# Patient Record
Sex: Male | Born: 1970 | Race: White | Hispanic: No | Marital: Married | Smoking: Never smoker
Health system: Southern US, Community
[De-identification: ages and names within clinical notes are randomized; demographics above are authoritative.]

## PROBLEM LIST (undated history)

## (undated) DIAGNOSIS — I10 Essential (primary) hypertension: Secondary | ICD-10-CM

## (undated) DIAGNOSIS — T7840XA Allergy, unspecified, initial encounter: Secondary | ICD-10-CM

## (undated) DIAGNOSIS — E785 Hyperlipidemia, unspecified: Secondary | ICD-10-CM

## (undated) DIAGNOSIS — G4730 Sleep apnea, unspecified: Secondary | ICD-10-CM

## (undated) DIAGNOSIS — J45909 Unspecified asthma, uncomplicated: Secondary | ICD-10-CM

## (undated) HISTORY — DX: Hyperlipidemia, unspecified: E78.5

## (undated) HISTORY — DX: Sleep apnea, unspecified: G47.30

## (undated) HISTORY — PX: APPENDECTOMY: SHX54

## (undated) HISTORY — DX: Allergy, unspecified, initial encounter: T78.40XA

---

## 2007-10-10 ENCOUNTER — Observation Stay (HOSPITAL_COMMUNITY): Admission: EM | Admit: 2007-10-10 | Discharge: 2007-10-11 | Payer: Self-pay | Admitting: Emergency Medicine

## 2007-10-10 ENCOUNTER — Encounter (INDEPENDENT_AMBULATORY_CARE_PROVIDER_SITE_OTHER): Payer: Self-pay | Admitting: General Surgery

## 2008-10-30 ENCOUNTER — Encounter: Admission: RE | Admit: 2008-10-30 | Discharge: 2008-10-30 | Payer: Self-pay | Admitting: Specialist

## 2009-10-26 ENCOUNTER — Encounter: Admission: RE | Admit: 2009-10-26 | Discharge: 2009-10-26 | Payer: Self-pay | Admitting: Specialist

## 2010-05-17 NOTE — Op Note (Signed)
NAMEINMER, NIX              ACCOUNT NO.:  [id number]   MEDICAL RECORD NO.:  [id number]          PATIENT TYPE:  OBV   LOCATION:  0098                         FACILITY:  [facility]   PHYSICIAN:  Anselm Pancoast. Weatherly, M.D.DATE OF BIRTH:  Mar 03, 1970   DATE OF PROCEDURE:  10/10/2007  DATE OF DISCHARGE:                               OPERATIVE REPORT   PREOPERATIVE DIAGNOSES:  Acute appendicitis, retrocecal, very high lying  cecum and exogenous obesity.   POSTOPERATIVE DIAGNOSIS:  Acute appendicitis, retrocecal, very high  lying cecum and exogenous obesity.   OPERATION:  Laparoscopic appendectomy.   ANESTHESIA:  General.   SURGEON:  Anselm Pancoast. Zachery Dakins, M.D.   ASSISTANT:  Nurse.   HISTORY:  Robert Kaufman is a 40 year old Caucasian male, 310 pounds,  who has had kind of an epigastric cramping sensation for about 2 days.  Today, he was seen by Dr. Raechel Ache, who found him tender, kind of  more in the upper right abdomen instead of the right lower quadrant and  referred him for a CT done at [facility].  There were no laboratory  studies performed.  CT was done without contrast or IV, and it is  consistent with an inflamed appendix and a lot inflammatory reaction,  kind of in the retrocecal area with a very high lying cecum.  The  patient was notified and they called and I suggested that he come to the  emergency room, where he was seen.  On review of the CT and also  examination, he is very tender, really at the right lateral rib cage  area.  He is not tender in the lower right abdomen.  On review of the  CT, this is where he has got a markedly inflamed appendix.  I started an  IV, got a CBC and CMET.  His white count was elevated and his CMET was  normal.  He was given 3 gm of Unasyn and approximately an hour later was  able to come over for laparoscopic appendectomy.  I discussed with him  that we would try to do it laparoscopically.  It is not truly a ruptured  appendix, and  hopefully we can remove it with the laparoscope.   DESCRIPTION OF PROCEDURE:  The patient was positioned on OR table with  PAS stockings, induction of general anesthesia endotracheal tube placed,  an oral tube into the stomach and then the abdomen was shaved around the  umbilicus, kind of left lower quadrant spot and right upper quadrant  spot.  I was not sure exactly where I was going to have to put the  trocars with his size.  A small incision was made below the umbilicus.  He has got about 4-5 inches of adipose tissue.  The fascia was opened.  A small incision picked up between two Kochers and then the underlying  preperitoneal fat and peritoneum was identified with kind of a little  opening made through the peritoneum.  Two traction sutures of 0 Vicryl  was placed and the Hassan cannula introduced.  On the bursal pass, I was  not really down into the  preperitoneal space when the peritoneal cavity  just in the preperitoneal space, but I positioned the Hassan cannula  again, and this time it was good and full of air into the peritoneum.  It was necessary to put the lock on the [facility] so it was as deep as  possible to go into the peritoneal cavity.  Next, __________ the small  bowel looks normal around the terminal ileum area, and I put a 5 mm  trocar, more like what you do with an appendectomy because that it where  the actual cecum and etc., is.  I then tried to figure out as far as  where would be the best for the lower trocar, placed it a lot more  medial than I normally do since we were trying to get to the right upper  quadrant.  Then with the camera sometimes at the umbilicus, other times  in the lower with the patient real rotated to the left and with the head  down kind of Trendelenburg position, I could then kind of open up the  peritoneum lateral.  I could identify a markedly inflamed appendix.  Fortunately, it was not ruptured, and its junction with the base of the  cecum I  could identify that fairly quickly.  I was able to get a  Kingsley Spittle or __________ grasper really right on the base of the appendix.  With this kind of elevated __________ dissection in the lower area, I  used the harmonic scalpel to kind of free up the little blood supply  going to the mesentery of the appendix.  The appendix itself was  markedly inflamed, nearly gangrenous, and I removed the fatty tissue  around it.  I then could get all except for the most distal portion of  the appendix with the exposure that I had.  At this point, I went ahead  and used the GI linear stapler to go across the base of the cecum of the  appendix, and then I could grasp the base of the appendix and elevate it  so it gave me a view in the distal portion of the appendix, which was  freed up completely.  Good hemostasis was obtained with the blunt  scalpel and then we put the inflamed appendix in an EndoCatch bag.  I  really looked carefully at the bed where we had removed everything.  I  think I good hemostasis.  Then when the camera was placed in the lower  pole, I withdrew the bag with the appendix and looked at it and then  completely removed the appendix.  Next, we reinspected the operative  field.  The closure of the cecum was good, no bleeding.  We irrigated,  aspirated it and aspirated the irrigation.  Next, the lower 10-11 in the  lower abdomen was withdrew.  There was no bleeding.  He has a very  __________ that was making it difficult for exposure.  Next the upper 5  mm port was withdrawn and then the carbon dioxide released.  I put two  additional figure-of-eight sutures of 0 Vicryl in the fascia below at  the umbilicus, tied all four and then closed the subcutaneous wound  after irrigation with 4-0 Monocryl.  Benzoin and Steri-Strips were  placed on the skin.  I am going to give him another 2-3 doses of  antibiotics intravenously.  Will check a white count in the morning.  If  his white count is  normal, we will let him go  home without antibiotics.  If he is having any elevated white count, he will probably go home on 2-  3 days of Augmentin.           ______________________________  Anselm Pancoast. Zachery Dakins, M.D.     WJW/MEDQ  D:  10/10/2007  T:  10/11/2007  Job:  [id number]

## 2010-05-17 NOTE — H&P (Signed)
NAMESAID, RUEB              ACCOUNT NO.:  [id number]   MEDICAL RECORD NO.:  [id number]          PATIENT TYPE:  OBV   LOCATION:  0098                         FACILITY:  [facility]   PHYSICIAN:  Anselm Pancoast. Weatherly, M.D.DATE OF BIRTH:  1970/08/06   DATE OF ADMISSION:  10/10/2007  DATE OF DISCHARGE:                              HISTORY & PHYSICAL   CHIEF COMPLAINT:  Abdominal pain.   HISTORY:  Robert Kaufman is a 40 year old male, who has had a couple of  days of pretty significant abdominal pain, kind of a cramping and  bloating all in the upper abdomen, and then yesterday evening his pain  sort of shifted to more in the right upper quadrant.  During the night,  he was kind of cramping and uncomfortable, not having any fever, and has  not actually vomited.  Then this morning, he was seen at the [facility], I think at [facility], and then sent over to  [facility] for a CAT scan.  The CAT scan showed an inflamed appendix  with a very high-lying cecum kind of in the subhepatic area, and then he  was referred to [place].  In the emergency room, I examined the patient and  he is definitely locally tender in the median subcostal area.   PAST MEDICAL HISTORY:  He is about 310 pounds, but he is on no  antihypertensive medications or other medical problems.   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   ALLERGIES:  None.   CHRONIC MEDICATIONS:  None.   PAST SURGICAL HISTORY:  I do not think he has actually had any surgery.  He has had no abdominal surgery at least.   PHYSICAL EXAMINATION:  VITAL SIGNS:  His temperature was 97.9, blood  pressure is 129/89, pulse 89, respirations 16.  The patient is about 6  feet 5 and weighs 310 pounds.  EYES, EARS, NOSE, AND THROAT:  Unremarkable.  LUNGS:  Clear.  CARDIAC:  Normal sinus rhythm.  ABDOMEN:  Definitely tender in the upper right abdomen with kind of  muscle guarding, but he is not generalized tender.  I did not  do a  rectal exam on him.  EXTREMITIES:  There is no pedal edema.  CNS:  Physiologic.   ADMISSION IMPRESSION:  1. Acute appendicitis, possibly kind of retrocecal and a very high-      lying cecum.  2. Exogenous obesity.   We will plan on proceeding with a laparoscopic appendectomy, and Unasyn  has been ordered preoperatively.  White count was about 11,600.  CMET  was unremarkable.           ______________________________  Anselm Pancoast. Zachery Dakins, M.D.     WJW/MEDQ  D:  10/10/2007  T:  10/10/2007  Job:  [id number]

## 2010-10-04 LAB — CBC
HCT: 43.1
HCT: 45.5
Hemoglobin: 15.5
MCHC: 33.3
MCV: 86.7
Platelets: 147 — ABNORMAL LOW
Platelets: 174
RBC: 5.27
RDW: 13.6
WBC: 11.6 — ABNORMAL HIGH

## 2010-10-04 LAB — COMPREHENSIVE METABOLIC PANEL
Albumin: 4
Alkaline Phosphatase: 70
BUN: 15
Chloride: 106
Potassium: 4
Total Bilirubin: 1.1

## 2010-10-04 LAB — DIFFERENTIAL
Basophils Absolute: 0
Basophils Relative: 0
Eosinophils Relative: 2
Monocytes Absolute: 0.6
Neutro Abs: 8.5 — ABNORMAL HIGH

## 2010-10-04 LAB — APTT: aPTT: 33

## 2012-02-20 ENCOUNTER — Other Ambulatory Visit: Payer: Self-pay | Admitting: Orthopedic Surgery

## 2012-02-21 ENCOUNTER — Encounter (HOSPITAL_COMMUNITY): Payer: Self-pay | Admitting: Pharmacy Technician

## 2012-02-22 ENCOUNTER — Encounter (HOSPITAL_COMMUNITY): Payer: Self-pay

## 2012-02-22 ENCOUNTER — Ambulatory Visit (HOSPITAL_COMMUNITY)
Admission: RE | Admit: 2012-02-22 | Discharge: 2012-02-22 | Disposition: A | Discharge status: Home / Self Care | Payer: 59 | Source: Ambulatory Visit | Attending: Orthopedic Surgery | Admitting: Orthopedic Surgery

## 2012-02-22 ENCOUNTER — Encounter (HOSPITAL_COMMUNITY)
Admission: RE | Admit: 2012-02-22 | Discharge: 2012-02-22 | Disposition: A | Discharge status: Home / Self Care | Payer: 59 | Source: Ambulatory Visit | Attending: Specialist | Admitting: Specialist

## 2012-02-22 DIAGNOSIS — M545 Low back pain, unspecified: Secondary | ICD-10-CM | POA: Insufficient documentation

## 2012-02-22 DIAGNOSIS — I10 Essential (primary) hypertension: Secondary | ICD-10-CM | POA: Insufficient documentation

## 2012-02-22 DIAGNOSIS — Z01818 Encounter for other preprocedural examination: Secondary | ICD-10-CM | POA: Insufficient documentation

## 2012-02-22 DIAGNOSIS — Z01812 Encounter for preprocedural laboratory examination: Secondary | ICD-10-CM | POA: Insufficient documentation

## 2012-02-22 HISTORY — DX: Unspecified asthma, uncomplicated: J45.909

## 2012-02-22 HISTORY — DX: Essential (primary) hypertension: I10

## 2012-02-22 LAB — SURGICAL PCR SCREEN: Staphylococcus aureus: POSITIVE — AB

## 2012-02-22 NOTE — Progress Notes (Signed)
PCR results faxed via EPIC to Dr Shelle Iron.

## 2012-02-22 NOTE — Progress Notes (Signed)
Cbc and BMP results placed on chart done 02/21/12 at [facility] PCP office.

## 2012-02-22 NOTE — Patient Instructions (Signed)
Robert Kaufman  02/22/2012   Your procedure is scheduled on:  02/23/12   Report to [facility] at   1030  AM.  Call this number if you have problems the morning of surgery: [phone]   Remember:   Do not eat food after midnite.  May have clear liquids until 0630am then npo.    Take these medicines the morning of surgery with A SIP OF WATER:    Do not wear jewelry,   Do not wear lotions, powders, or perfumes.    Men may shave face and neck.  Do not bring valuables to the hospital.  Contacts, dentures or bridgework may not be worn into surgery.  Leave suitcase in the car. After surgery it may be brought to your room.  For patients admitted to the hospital, checkout time is 11:00 AM the day of  discharge.   SEE CHG INSTRUCTION SHEET    Please read over the following fact sheets that you were given: MRSA Information, coughing and deep breathing exercises, leg exercises               Failure to comply with these instructions may result in cancellation of your surgery.                Patient Signature ____________________________              Nurse Signature _____________________________

## 2012-02-22 NOTE — Progress Notes (Signed)
Surgical clearance from 02/21/12 from Dr Beverley Fiedler on chart  EKG 06/01/11 on chart  Sleep Report 02/23/09 on chart  [facility] 10/18/11 note on chart

## 2012-02-23 ENCOUNTER — Encounter (HOSPITAL_COMMUNITY): Payer: Self-pay | Admitting: *Deleted

## 2012-02-23 ENCOUNTER — Ambulatory Visit (HOSPITAL_COMMUNITY): Payer: 59

## 2012-02-23 ENCOUNTER — Encounter (HOSPITAL_COMMUNITY)
Admission: RE | Disposition: A | Discharge status: Home / Self Care | Payer: Self-pay | Source: Ambulatory Visit | Attending: Specialist

## 2012-02-23 ENCOUNTER — Ambulatory Visit (HOSPITAL_COMMUNITY): Payer: 59 | Admitting: Registered Nurse

## 2012-02-23 ENCOUNTER — Ambulatory Visit (HOSPITAL_COMMUNITY)
Admission: RE | Admit: 2012-02-23 | Discharge: 2012-02-24 | Disposition: A | Discharge status: Home / Self Care | Payer: 59 | Source: Ambulatory Visit | Attending: Specialist | Admitting: Specialist

## 2012-02-23 ENCOUNTER — Encounter (HOSPITAL_COMMUNITY): Payer: Self-pay | Admitting: Registered Nurse

## 2012-02-23 DIAGNOSIS — Z6837 Body mass index (BMI) 37.0-37.9, adult: Secondary | ICD-10-CM | POA: Insufficient documentation

## 2012-02-23 DIAGNOSIS — J45909 Unspecified asthma, uncomplicated: Secondary | ICD-10-CM | POA: Insufficient documentation

## 2012-02-23 DIAGNOSIS — Z01812 Encounter for preprocedural laboratory examination: Secondary | ICD-10-CM | POA: Insufficient documentation

## 2012-02-23 DIAGNOSIS — M5126 Other intervertebral disc displacement, lumbar region: Secondary | ICD-10-CM

## 2012-02-23 DIAGNOSIS — I10 Essential (primary) hypertension: Secondary | ICD-10-CM | POA: Insufficient documentation

## 2012-02-23 DIAGNOSIS — Z79899 Other long term (current) drug therapy: Secondary | ICD-10-CM | POA: Insufficient documentation

## 2012-02-23 DIAGNOSIS — Z01818 Encounter for other preprocedural examination: Secondary | ICD-10-CM | POA: Insufficient documentation

## 2012-02-23 DIAGNOSIS — Z7982 Long term (current) use of aspirin: Secondary | ICD-10-CM | POA: Insufficient documentation

## 2012-02-23 HISTORY — PX: DECOMPRESSIVE LUMBAR LAMINECTOMY LEVEL 1: SHX5791

## 2012-02-23 SURGERY — DECOMPRESSIVE LUMBAR LAMINECTOMY LEVEL 1
Anesthesia: General | Site: Back | Laterality: Right | Wound class: Clean

## 2012-02-23 MED ORDER — ADULT MULTIVITAMIN W/MINERALS CH
1.0000 | ORAL_TABLET | Freq: Every day | ORAL | Status: DC
  Filled 2012-02-23 (×2): qty 1

## 2012-02-23 MED ORDER — METHOCARBAMOL 500 MG PO TABS
500.0000 mg | ORAL_TABLET | Freq: Four times a day (QID) | ORAL | Status: DC | PRN
  Administered 2012-02-23 – 2012-02-24 (×3): 500 mg via ORAL
  Filled 2012-02-23 (×3): qty 1

## 2012-02-23 MED ORDER — THROMBIN 5000 UNITS EX SOLR: CUTANEOUS | Status: DC | PRN

## 2012-02-23 MED ORDER — HYDROCHLOROTHIAZIDE 25 MG PO TABS
25.0000 mg | ORAL_TABLET | Freq: Every day | ORAL | Status: DC
  Filled 2012-02-23: qty 1

## 2012-02-23 MED ORDER — MIDAZOLAM HCL 5 MG/5ML IJ SOLN
INTRAMUSCULAR | Status: DC | PRN
  Administered 2012-02-23: 2 mg via INTRAVENOUS

## 2012-02-23 MED ORDER — HYDROMORPHONE HCL PF 1 MG/ML IJ SOLN
INTRAMUSCULAR | Status: AC
  Filled 2012-02-23: qty 1

## 2012-02-23 MED ORDER — LISINOPRIL 20 MG PO TABS
20.0000 mg | ORAL_TABLET | Freq: Every day | ORAL | Status: DC
  Filled 2012-02-23: qty 1

## 2012-02-23 MED ORDER — DOCUSATE SODIUM 100 MG PO CAPS
100.0000 mg | ORAL_CAPSULE | Freq: Two times a day (BID) | ORAL | Status: DC
  Administered 2012-02-23 – 2012-02-24 (×2): 100 mg via ORAL

## 2012-02-23 MED ORDER — ONDANSETRON HCL 4 MG/2ML IJ SOLN: 4.0000 mg | INTRAMUSCULAR | Status: DC | PRN

## 2012-02-23 MED ORDER — CHLORHEXIDINE GLUCONATE 4 % EX LIQD
60.0000 mL | Freq: Once | CUTANEOUS | Status: DC
  Filled 2012-02-23: qty 60

## 2012-02-23 MED ORDER — PROPOFOL 10 MG/ML IV BOLUS
INTRAVENOUS | Status: DC | PRN
  Administered 2012-02-23: 200 mg via INTRAVENOUS

## 2012-02-23 MED ORDER — PHENOL 1.4 % MT LIQD: 1.0000 | OROMUCOSAL | Status: DC | PRN

## 2012-02-23 MED ORDER — FLUTICASONE PROPIONATE 50 MCG/ACT NA SUSP
1.0000 | Freq: Every day | NASAL | Status: DC
  Filled 2012-02-23: qty 16

## 2012-02-23 MED ORDER — CEFAZOLIN SODIUM 1-5 GM-% IV SOLN
INTRAVENOUS | Status: AC
  Filled 2012-02-23: qty 50

## 2012-02-23 MED ORDER — HYDROMORPHONE HCL PF 1 MG/ML IJ SOLN
0.2500 mg | INTRAMUSCULAR | Status: DC | PRN
  Administered 2012-02-23 (×2): 0.5 mg via INTRAVENOUS

## 2012-02-23 MED ORDER — LIDOCAINE HCL (CARDIAC) 20 MG/ML IV SOLN
INTRAVENOUS | Status: DC | PRN
  Administered 2012-02-23: 100 mg via INTRAVENOUS

## 2012-02-23 MED ORDER — ONDANSETRON HCL 4 MG/2ML IJ SOLN
INTRAMUSCULAR | Status: DC | PRN
  Administered 2012-02-23: 4 mg via INTRAVENOUS

## 2012-02-23 MED ORDER — LACTATED RINGERS IV SOLN: INTRAVENOUS | Status: DC

## 2012-02-23 MED ORDER — SUCCINYLCHOLINE CHLORIDE 20 MG/ML IJ SOLN
INTRAMUSCULAR | Status: DC | PRN
  Administered 2012-02-23: 100 mg via INTRAVENOUS

## 2012-02-23 MED ORDER — LACTATED RINGERS IV SOLN
INTRAVENOUS | Status: DC
  Administered 2012-02-23 (×2): via INTRAVENOUS

## 2012-02-23 MED ORDER — ACETAMINOPHEN 650 MG RE SUPP: 650.0000 mg | RECTAL | Status: DC | PRN

## 2012-02-23 MED ORDER — ACETAMINOPHEN 325 MG PO TABS: 650.0000 mg | ORAL_TABLET | ORAL | Status: DC | PRN

## 2012-02-23 MED ORDER — METHOCARBAMOL 100 MG/ML IJ SOLN: 500.0000 mg | Freq: Four times a day (QID) | INTRAVENOUS | Status: DC | PRN

## 2012-02-23 MED ORDER — MOMETASONE FURO-FORMOTEROL FUM 100-5 MCG/ACT IN AERO
2.0000 | INHALATION_SPRAY | Freq: Two times a day (BID) | RESPIRATORY_TRACT | Status: DC
  Administered 2012-02-24: 2 via RESPIRATORY_TRACT
  Filled 2012-02-23: qty 8.8

## 2012-02-23 MED ORDER — SODIUM CHLORIDE 0.45 % IV SOLN
INTRAVENOUS | Status: DC
  Administered 2012-02-23: 18:00:00 via INTRAVENOUS

## 2012-02-23 MED ORDER — BUPIVACAINE-EPINEPHRINE 0.5% -1:200000 IJ SOLN
INTRAMUSCULAR | Status: DC | PRN
  Administered 2012-02-23: 18 mL

## 2012-02-23 MED ORDER — MONTELUKAST SODIUM 10 MG PO TABS
10.0000 mg | ORAL_TABLET | Freq: Every day | ORAL | Status: DC
  Filled 2012-02-23: qty 1

## 2012-02-23 MED ORDER — FENTANYL CITRATE 0.05 MG/ML IJ SOLN
INTRAMUSCULAR | Status: DC | PRN
  Administered 2012-02-23 (×4): 50 ug via INTRAVENOUS

## 2012-02-23 MED ORDER — HYDROCODONE-ACETAMINOPHEN 5-325 MG PO TABS
1.0000 | ORAL_TABLET | ORAL | Status: DC | PRN
  Administered 2012-02-24: 2 via ORAL
  Filled 2012-02-23: qty 2

## 2012-02-23 MED ORDER — PROMETHAZINE HCL 25 MG/ML IJ SOLN: 6.2500 mg | INTRAMUSCULAR | Status: DC | PRN

## 2012-02-23 MED ORDER — ACETAMINOPHEN 10 MG/ML IV SOLN
INTRAVENOUS | Status: AC
  Filled 2012-02-23: qty 100

## 2012-02-23 MED ORDER — SENNOSIDES-DOCUSATE SODIUM 8.6-50 MG PO TABS
1.0000 | ORAL_TABLET | Freq: Every evening | ORAL | Status: DC | PRN
  Filled 2012-02-23: qty 1

## 2012-02-23 MED ORDER — THROMBIN 5000 UNITS EX SOLR
CUTANEOUS | Status: AC
Start: 2012-02-23 — End: 2012-02-23
  Filled 2012-02-23: qty 10000

## 2012-02-23 MED ORDER — ROCURONIUM BROMIDE 100 MG/10ML IV SOLN
INTRAVENOUS | Status: DC | PRN
  Administered 2012-02-23: 50 mg via INTRAVENOUS
  Administered 2012-02-23: 10 mg via INTRAVENOUS

## 2012-02-23 MED ORDER — ACETAMINOPHEN 10 MG/ML IV SOLN
INTRAVENOUS | Status: DC | PRN
  Administered 2012-02-23: 1000 mg via INTRAVENOUS

## 2012-02-23 MED ORDER — CEFAZOLIN SODIUM-DEXTROSE 2-3 GM-% IV SOLR
INTRAVENOUS | Status: AC
  Filled 2012-02-23: qty 50

## 2012-02-23 MED ORDER — HYDROMORPHONE HCL PF 1 MG/ML IJ SOLN: 0.5000 mg | INTRAMUSCULAR | Status: DC | PRN

## 2012-02-23 MED ORDER — MENTHOL 3 MG MT LOZG: 1.0000 | LOZENGE | OROMUCOSAL | Status: DC | PRN

## 2012-02-23 MED ORDER — THROMBIN 5000 UNITS EX SOLR
OROMUCOSAL | Status: DC | PRN
  Administered 2012-02-23: 14:00:00 via TOPICAL

## 2012-02-23 MED ORDER — CEFAZOLIN SODIUM-DEXTROSE 2-3 GM-% IV SOLR
2.0000 g | Freq: Three times a day (TID) | INTRAVENOUS | Status: AC
  Administered 2012-02-23 – 2012-02-24 (×2): 2 g via INTRAVENOUS
  Filled 2012-02-23 (×2): qty 50

## 2012-02-23 MED ORDER — LISINOPRIL-HYDROCHLOROTHIAZIDE 20-25 MG PO TABS: 1.0000 | ORAL_TABLET | Freq: Every day | ORAL | Status: DC

## 2012-02-23 MED ORDER — OXYCODONE-ACETAMINOPHEN 7.5-325 MG PO TABS: 1.0000 | ORAL_TABLET | ORAL | Status: DC | PRN

## 2012-02-23 MED ORDER — SODIUM CHLORIDE 0.9 % IJ SOLN
3.0000 mL | Freq: Two times a day (BID) | INTRAMUSCULAR | Status: DC
  Administered 2012-02-24: 3 mL via INTRAVENOUS

## 2012-02-23 MED ORDER — BUPIVACAINE-EPINEPHRINE (PF) 0.5% -1:200000 IJ SOLN
INTRAMUSCULAR | Status: AC
  Filled 2012-02-23: qty 10

## 2012-02-23 MED ORDER — METHOCARBAMOL 500 MG PO TABS: 500.0000 mg | ORAL_TABLET | Freq: Three times a day (TID) | ORAL | Status: DC

## 2012-02-23 MED ORDER — SODIUM CHLORIDE 0.9 % IR SOLN
Status: DC | PRN
  Administered 2012-02-23: 14:00:00

## 2012-02-23 MED ORDER — BISACODYL 5 MG PO TBEC: 5.0000 mg | DELAYED_RELEASE_TABLET | Freq: Every day | ORAL | Status: DC | PRN

## 2012-02-23 MED ORDER — DEXTROSE 5 % IV SOLN: 3.0000 g | Freq: Three times a day (TID) | INTRAVENOUS | Status: DC

## 2012-02-23 MED ORDER — OXYCODONE-ACETAMINOPHEN 5-325 MG PO TABS
1.0000 | ORAL_TABLET | ORAL | Status: DC | PRN
  Administered 2012-02-23: 1 via ORAL
  Administered 2012-02-24: 2 via ORAL
  Filled 2012-02-23: qty 2
  Filled 2012-02-23: qty 1

## 2012-02-23 MED ORDER — SODIUM CHLORIDE 0.9 % IJ SOLN: 3.0000 mL | INTRAMUSCULAR | Status: DC | PRN

## 2012-02-23 MED ORDER — LORATADINE 10 MG PO TABS
10.0000 mg | ORAL_TABLET | Freq: Every day | ORAL | Status: DC
  Filled 2012-02-23: qty 1

## 2012-02-23 MED ORDER — SODIUM CHLORIDE 0.9 % IV SOLN: 250.0000 mL | INTRAVENOUS | Status: DC

## 2012-02-23 MED ORDER — DEXTROSE 5 % IV SOLN
3.0000 g | INTRAVENOUS | Status: AC
  Administered 2012-02-23: 3 g via INTRAVENOUS

## 2012-02-23 SURGICAL SUPPLY — 52 items
APL SKNCLS STERI-STRIP NONHPOA (GAUZE/BANDAGES/DRESSINGS)
BAG SPEC THK2 15X12 ZIP CLS (MISCELLANEOUS)
BAG ZIPLOCK 12X15 (MISCELLANEOUS) ×1 IMPLANT
BENZOIN TINCTURE PRP APPL 2/3 (GAUZE/BANDAGES/DRESSINGS) ×1 IMPLANT
CHLORAPREP W/TINT 26ML (MISCELLANEOUS) IMPLANT
CLEANER TIP ELECTROSURG 2X2 (MISCELLANEOUS) ×2 IMPLANT
CLOTH BEACON ORANGE TIMEOUT ST (SAFETY) ×2 IMPLANT
DECANTER SPIKE VIAL GLASS SM (MISCELLANEOUS) ×1 IMPLANT
DRAPE MICROSCOPE LEICA (MISCELLANEOUS) ×2 IMPLANT
DRAPE POUCH INSTRU U-SHP 10X18 (DRAPES) ×2 IMPLANT
DRAPE SURG 17X11 SM STRL (DRAPES) ×2 IMPLANT
DRSG AQUACEL AG ADV 3.5X 4 (GAUZE/BANDAGES/DRESSINGS) ×1 IMPLANT
DRSG EMULSION OIL 3X3 NADH (GAUZE/BANDAGES/DRESSINGS) IMPLANT
DRSG PAD ABDOMINAL 8X10 ST (GAUZE/BANDAGES/DRESSINGS) IMPLANT
DRSG TELFA 4X5 ISLAND ADH (GAUZE/BANDAGES/DRESSINGS) IMPLANT
DURAPREP 26ML APPLICATOR (WOUND CARE) ×2 IMPLANT
DURASEAL SPINE SEALANT 3ML (MISCELLANEOUS) IMPLANT
ELECT REM PT RETURN 9FT ADLT (ELECTROSURGICAL) ×2
ELECTRODE REM PT RTRN 9FT ADLT (ELECTROSURGICAL) ×1 IMPLANT
GLOVE BIOGEL PI IND STRL 7.5 (GLOVE) ×1 IMPLANT
GLOVE BIOGEL PI IND STRL 8 (GLOVE) ×1 IMPLANT
GLOVE BIOGEL PI INDICATOR 7.5 (GLOVE) ×1
GLOVE BIOGEL PI INDICATOR 8 (GLOVE) ×1
GLOVE SURG SS PI 7.5 STRL IVOR (GLOVE) ×2 IMPLANT
GLOVE SURG SS PI 8.0 STRL IVOR (GLOVE) ×4 IMPLANT
GOWN STRL REIN XL XLG (GOWN DISPOSABLE) ×4 IMPLANT
IV CATH 14GX2 1/4 (CATHETERS) ×2 IMPLANT
KIT BASIN OR (CUSTOM PROCEDURE TRAY) ×2 IMPLANT
KIT POSITIONING SURG ANDREWS (MISCELLANEOUS) ×2 IMPLANT
MANIFOLD NEPTUNE II (INSTRUMENTS) ×2 IMPLANT
NEEDLE SPNL 18GX3.5 QUINCKE PK (NEEDLE) ×4 IMPLANT
PATTIES SURGICAL .5 X.5 (GAUZE/BANDAGES/DRESSINGS) IMPLANT
PATTIES SURGICAL .75X.75 (GAUZE/BANDAGES/DRESSINGS) IMPLANT
PATTIES SURGICAL 1X1 (DISPOSABLE) IMPLANT
SPONGE LAP 18X18 X RAY DECT (DISPOSABLE) ×1 IMPLANT
SPONGE SURGIFOAM ABS GEL 100 (HEMOSTASIS) ×2 IMPLANT
STAPLER VISISTAT (STAPLE) IMPLANT
STRIP CLOSURE SKIN 1/2X4 (GAUZE/BANDAGES/DRESSINGS) ×1 IMPLANT
SUT PROLENE 3 0 PS 2 (SUTURE) ×1 IMPLANT
SUT VIC AB 0 CT1 27 (SUTURE)
SUT VIC AB 0 CT1 27XBRD ANTBC (SUTURE) IMPLANT
SUT VIC AB 1 CT1 27 (SUTURE)
SUT VIC AB 1 CT1 27XBRD ANTBC (SUTURE) ×1 IMPLANT
SUT VIC AB 1-0 CT2 27 (SUTURE) ×2 IMPLANT
SUT VIC AB 2-0 CT1 27 (SUTURE) ×2
SUT VIC AB 2-0 CT1 27XBRD (SUTURE) IMPLANT
SUT VIC AB 2-0 CT1 TAPERPNT 27 (SUTURE) ×1 IMPLANT
SUT VIC AB 2-0 CT2 27 (SUTURE) ×1 IMPLANT
SUT VICRYL 0 UR6 27IN ABS (SUTURE) IMPLANT
SYRINGE 10CC LL (SYRINGE) ×2 IMPLANT
TRAY LAMINECTOMY (CUSTOM PROCEDURE TRAY) ×2 IMPLANT
YANKAUER SUCT BULB TIP NO VENT (SUCTIONS) ×2 IMPLANT

## 2012-02-23 NOTE — Anesthesia Postprocedure Evaluation (Signed)
Anesthesia Post Note  Patient: Robert Kaufman  Procedure(s) Performed: Procedure(s) (LRB): MICRO-LUMBAR DECOMPRESSION  L4-L5 RIGHT     (Right)  Anesthesia type: General  Patient location: PACU  Post pain: Pain level controlled  Post assessment: Post-op Vital signs reviewed  Last Vitals:  Filed Vitals:   02/23/12 1630  BP: 110/64  Pulse: 76  Temp: 36.5 C  Resp: 12    Post vital signs: Reviewed  Level of consciousness: sedated  Complications: No apparent anesthesia complications

## 2012-02-23 NOTE — Transfer of Care (Signed)
Immediate Anesthesia Transfer of Care Note  Patient: Robert Kaufman  Procedure(s) Performed: Procedure(s) with comments: MICRO-LUMBAR DECOMPRESSION  L4-L5 RIGHT     (Right) - MICRO-LUMBAR DECOMPRESSION  L4-L5 RIGHT      Patient Location: PACU  Anesthesia Type:General  Level of Consciousness: awake, sedated and patient cooperative  Airway & Oxygen Therapy: Patient Spontanous Breathing and Patient connected to face mask oxygen  Post-op Assessment: Report given to PACU RN and Post -op Vital signs reviewed and stable  Post vital signs: Reviewed and stable  Complications: No apparent anesthesia complications

## 2012-02-23 NOTE — Preoperative (Signed)
Beta Blockers   Reason not to administer Beta Blockers:Not Applicable 

## 2012-02-23 NOTE — Anesthesia Preprocedure Evaluation (Addendum)
Anesthesia Evaluation  Patient identified by MRN, date of birth, ID band Patient awake    Reviewed: Allergy & Precautions, H&P , NPO status , Patient's Chart, lab work & pertinent test results  History of Anesthesia Complications (+) AWARENESS UNDER ANESTHESIA  Airway Mallampati: III TM Distance: >3 FB Neck ROM: Full    Dental  (+) Teeth Intact and Dental Advisory Given   Pulmonary neg pulmonary ROS, asthma ,  breath sounds clear to auscultation  Pulmonary exam normal       Cardiovascular hypertension, negative cardio ROS  Rhythm:Regular Rate:Normal     Neuro/Psych negative neurological ROS  negative psych ROS   GI/Hepatic negative GI ROS, Neg liver ROS,   Endo/Other  Morbid obesity  Renal/GU negative Renal ROS  negative genitourinary   Musculoskeletal negative musculoskeletal ROS (+)   Abdominal (+) + obese,   Peds  Hematology negative hematology ROS (+)   Anesthesia Other Findings   Reproductive/Obstetrics negative OB ROS                          Anesthesia Physical Anesthesia Plan  ASA: III  Anesthesia Plan: General   Post-op Pain Management:    Induction: Intravenous  Airway Management Planned: Oral ETT  Additional Equipment:   Intra-op Plan:   Post-operative Plan: Extubation in OR  Informed Consent: I have reviewed the patients History and Physical, chart, labs and discussed the procedure including the risks, benefits and alternatives for the proposed anesthesia with the patient or authorized representative who has indicated his/her understanding and acceptance.   Dental advisory given  Plan Discussed with: CRNA  Anesthesia Plan Comments:         Anesthesia Quick Evaluation

## 2012-02-23 NOTE — Brief Op Note (Signed)
02/23/2012  3:29 PM  PATIENT:  Savon Bordonaro  42 y.o. male  PRE-OPERATIVE DIAGNOSIS:  hnp and stenosis   POST-OPERATIVE DIAGNOSIS:  herniated nucleus pulposus  PROCEDURE:  Procedure(s) with comments: MICRO-LUMBAR DECOMPRESSION  L4-L5 RIGHT     (Right) - MICRO-LUMBAR DECOMPRESSION  L4-L5 RIGHT      SURGEON:  Surgeon(s) and Role:    * Javier Docker, MD - Primary  PHYSICIAN ASSISTANT:   ASSISTANTS: Dawayne Cirri ANESTHESIA:   general  EBL:  Total I/O In: 1000 [I.V.:1000] Out: -   BLOOD ADMINISTERED:none  DRAINS: none   LOCAL MEDICATIONS USED:  MARCAINE     SPECIMEN:  Source of Specimen:  L45  DISPOSITION OF SPECIMEN:  PATHOLOGY  COUNTS:  YES  TOURNIQUET:  * No tourniquets in log *  DICTATION: .Other Dictation: Dictation Number [id number]  PLAN OF CARE: Admit for overnight observation  PATIENT DISPOSITION:  PACU - hemodynamically stable.   Delay start of Pharmacological VTE agent (>24hrs) due to surgical blood loss or risk of bleeding: yes

## 2012-02-23 NOTE — H&P (Addendum)
Robert Kaufman is an 42 y.o. male.   Chief Complaint: right leg pain HPI: HNP stenosis L45 right refractory  Past Medical History  Diagnosis Date  . Hypertension   . Asthma     Past Surgical History  Procedure Laterality Date  . Appendectomy      History reviewed. No pertinent family history. Social History:  reports that he has never smoked. He has never used smokeless tobacco. He reports that he drinks about 0.5 ounces of alcohol per week. He reports that he does not use illicit drugs.  Allergies: No Known Allergies  Medications Prior to Admission  Medication Sig Dispense Refill  . cetirizine (ZYRTEC ALLERGY) 10 MG tablet Take 10 mg by mouth daily.      . fluticasone (FLONASE) 50 MCG/ACT nasal spray Place 1 spray into the nose daily.      . Fluticasone-Salmeterol (ADVAIR) 100-50 MCG/DOSE AEPB Inhale 1 puff into the lungs every 12 (twelve) hours.      Marland Kitchen HYDROcodone-acetaminophen (NORCO/VICODIN) 5-325 MG per tablet Take 1 tablet by mouth every 6 (six) hours as needed for pain.      Marland Kitchen lisinopril-hydrochlorothiazide (PRINZIDE,ZESTORETIC) 20-25 MG per tablet Take 1 tablet by mouth daily before breakfast.      . montelukast (SINGULAIR) 10 MG tablet Take 10 mg by mouth daily before breakfast.      . rosuvastatin (CRESTOR) 10 MG tablet Take 10 mg by mouth daily before breakfast.      . aspirin EC 81 MG tablet Take 81 mg by mouth daily.      . meloxicam (MOBIC) 7.5 MG tablet Take 7.5 mg by mouth daily.      . Multiple Vitamin (MULTIVITAMIN WITH MINERALS) TABS Take 1 tablet by mouth daily.        Results for orders placed during the hospital encounter of 02/22/12 (from the past 48 hour(s))  SURGICAL PCR SCREEN     Status: Abnormal   Collection Time    02/22/12 12:00 PM      Result Value Range   MRSA, PCR NEGATIVE  NEGATIVE   Staphylococcus aureus POSITIVE (*) NEGATIVE   Comment:            The Xpert SA Assay (FDA     approved for NASAL specimens     in patients over 21 years of  age),     is one component of     a comprehensive surveillance     program.  Test performance has     been validated by [employer] for patients greater     than or equal to 23 year old.     It is not intended     to diagnose infection nor to     guide or monitor treatment.   Dg Chest 2 View  02/22/2012  *RADIOLOGY REPORT*  Clinical Data: Preoperative evaluation for back surgery.  Right lower back pain.  History of hypertension  CHEST - 2 VIEW  Comparison: None.  Findings: Heart and mediastinal contours are within normal limits. Lung fields are clear with no signs of focal infiltrate or congestive failure.  No pleural fluid or significant peribronchial cuffing is seen.  Bony structures demonstrate mild degenerative change of the mid and lower thoracic spine and are otherwise intact.  IMPRESSION: No worrisome focal or acute cardiopulmonary abnormality noted   Original Report Authenticated By: Rhodia Albright, M.D.    Dg Lumbar Spine 2-3 Views  02/22/2012  *RADIOLOGY REPORT*  Clinical Data:  Right-sided low back pain.  Preoperative evaluation.  LUMBAR SPINE - 2-3 VIEW  Comparison: Lumbar MRI 10/30/2008.  Findings: There are five non-rib bearing lumbar-type vertebral bodies.  There is slight narrowing of the intervertebral disc space at the level of L4-L5.  Moderate multilevel osteophyte formation is seen representing degenerative spondylosis.  No fracture, dislocation, or bony destruction is evident.  IMPRESSION: Changes of degenerative disc disease and degenerative spondylosis.   Original Report Authenticated By: Onalee Hua Call     Review of Systems  Neurological: Positive for sensory change and focal weakness.  All other systems reviewed and are negative.    Blood pressure 108/66, pulse 76, temperature 98.3 F (36.8 Kaufman), resp. rate 20, SpO2 100.00%. Physical Exam  Vitals reviewed. Constitutional: He appears well-developed.  HENT:  Head: Normocephalic.  Eyes: Pupils are equal, round, and  reactive to light.  Neck: Normal range of motion.  Cardiovascular: Normal rate.   Respiratory: Effort normal.  GI: Soft.  Neurological: He is alert.  Skin: Skin is warm and dry.  Psychiatric: He has a normal mood and affect.   MRI HNP stenosis L45 right EHL 5-/5 right. SLR + No DVT. 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 Plan decompression. Risks discussed.  Robert Kaufman,Robert Kaufman 02/23/2012, 12:44 PM

## 2012-02-24 DIAGNOSIS — Z09 Encounter for follow-up examination after completed treatment for conditions other than malignant neoplasm: Secondary | ICD-10-CM

## 2012-02-24 NOTE — Care Management (Signed)
Cm spoke with patient concerning discharge planning. Pt ambulatory in hallway independently. No needs or HH services stated.   Roxy Manns Jaonna Word,RN,BSN [phone]

## 2012-02-24 NOTE — Evaluation (Signed)
Physical Therapy Evaluation Patient Details Name: Robert Kaufman MRN: [id number] DOB: 1970/08/01 Today's Date: 02/24/2012 Time: 4098-1191 PT Time Calculation (min): 33 min  PT Assessment / Plan / Recommendation Clinical Impression  Pt s/p microlumbar decompress presents with functional mobility limited by post op pain and back precautions.    PT Assessment  Patent does not need any further PT services    Follow Up Recommendations  No PT follow up    Does the patient have the potential to tolerate intense rehabilitation      Barriers to Discharge        Equipment Recommendations  None recommended by PT    Recommendations for Other Services OT consult   Frequency      Precautions / Restrictions Precautions Precautions: Back Restrictions Weight Bearing Restrictions: No   Pertinent Vitals/Pain 3/10 with amb      Mobility  Bed Mobility Bed Mobility: Supine to Sit;Sit to Supine;Rolling Right;Rolling Left;Left Sidelying to Sit;Sit to Sidelying Left Rolling Right: 5: Supervision Rolling Left: 5: Supervision Left Sidelying to Sit: 4: Min guard Supine to Sit: 4: Min guard Sit to Supine: 4: Min guard;5: Supervision Sit to Sidelying Left: 4: Min guard;5: Supervision Details for Bed Mobility Assistance: min cues for technique and to complete roll to side Transfers Transfers: Sit to Stand;Stand to Sit Sit to Stand: 5: Supervision Stand to Sit: 5: Supervision Details for Transfer Assistance: cues for use of UEs and avoidance of excess fwd flex Ambulation/Gait Ambulation/Gait Assistance: 4: Min guard;5: Supervision Ambulation Distance (Feet): 500 Feet Ambulation/Gait Assistance Details: min cues for pacing Gait Pattern: Step-through pattern Stairs: Yes Stairs Assistance: 4: Min guard Stair Management Technique: One rail Right;Step to pattern;Forwards Number of Stairs: 4    Exercises     PT Diagnosis:    PT Problem List:   PT Treatment Interventions:     PT  Goals Acute Rehab PT Goals PT Goal Formulation: With patient  Visit Information  Last PT Received On: 02/24/12 Assistance Needed: +1    Subjective Data  Subjective: Before surgery, I could only be up on my feet for 5 minutes before I had to lay back down Patient Stated Goal: Resume previous lifestyle with decreased pain   Prior Functioning  Home Living Lives With: Spouse Available Help at Discharge: Family Type of Home: House Home Access: Stairs to enter Secretary/administrator of Steps: 2 Entrance Stairs-Rails: None Home Layout: Two level Alternate Level Stairs-Number of Steps: 14 Alternate Level Stairs-Rails: Right Home Adaptive Equipment: None Prior Function Level of Independence: Independent Able to Take Stairs?: Yes Driving: Yes Vocation: Full time employment Communication Communication: No difficulties Dominant Hand: Right    Cognition  Cognition Overall Cognitive Status: Appears within functional limits for tasks assessed/performed Arousal/Alertness: Awake/alert Orientation Level: Appears intact for tasks assessed Behavior During Session: [facility] for tasks performed    Extremity/Trunk Assessment Right Upper Extremity Assessment RUE ROM/Strength/Tone: [facility] for tasks assessed Left Upper Extremity Assessment LUE ROM/Strength/Tone: WFL for tasks assessed Right Lower Extremity Assessment RLE ROM/Strength/Tone: [facility] for tasks assessed Left Lower Extremity Assessment LLE ROM/Strength/Tone: WFL for tasks assessed   Balance    End of Session PT - End of Session Activity Tolerance: Patient tolerated treatment well Patient left: in bed;with call bell/phone within reach (pt sitting in chair but unable to tolerate beyond 5 min 2* p) Nurse Communication: Mobility status  GP Functional Assessment Tool Used: clinical judgement Functional Limitation: Mobility: Walking and moving around Mobility: Walking and Moving Around Current Status ([id number]): 0 percent impaired,  limited or  restricted Mobility: Walking and Moving Around Goal Status [phone]): 0 percent impaired, limited or restricted Mobility: Walking and Moving Around Discharge Status [phone]): 0 percent impaired, limited or restricted   Jashiya Bassett 02/24/2012, 9:19 AM

## 2012-02-24 NOTE — Op Note (Signed)
NAMEOLUWAFERANMI, WAIN              ACCOUNT NO.:  [id number]  MEDICAL RECORD NO.:  [id number]  LOCATION:  1602                         FACILITY:  [facility]  PHYSICIAN:  Jene Every, M.D.    DATE OF BIRTH:  04-07-1970  DATE OF PROCEDURE:  02/23/2012 DATE OF DISCHARGE:                              OPERATIVE REPORT   PREOPERATIVE DIAGNOSES:  Spinal stenosis; herniated nucleus pulposus, L4- 5, right; obesity.  POSTOPERATIVE DIAGNOSES:  Spinal stenosis; herniated nucleus pulposus, L4-5, right; obesity.  PROCEDURES PERFORMED: 1. Microlumbar decompression L4-5. 2. Foraminotomies L4 and L5. 3. Microdiskectomy L4-5.  ANESTHESIA:  General.  ASSISTANT:  Lanna Poche.  __________ the patient's elevated BMI of 37.9.  HISTORY:  This is a 42 year old with chronic refractory lower extremity radicular pain, recurrent disk herniation, L5 nerve root distribution, had exacerbation without abatement.  MRI indicated larger disk herniation displacing the 5 root __________ EHL weakness.  The patient had 3 weeks of being unable to rise in the supine position for longer than 2-3 minutes at a time without severe radicular pain.  Failing conservative treatment is indicated for decompression.  Risks and benefits discussed including bleeding, infection, damage to neurovascular structure, DVT, PE, anesthetic complication, residual nerve pain, re-enervation neuritis, need for fusion in the future, etc.  TECHNIQUE:  With the patient in supine position, after induction of adequate general anesthesia, 3 g Kefzol and 600 clindamycin, he was placed prone on the Miller frame.  All bony prominences were well padded.  Lumbar region was prepped and draped in usual sterile fashion. Two 18-gauge spinal needles were utilized to localize L4-5 interspace, confirmed with x-ray.  Incision was made from spinous process 4-5. Subcutaneous tissue was dissected.  Electrocautery was utilized to achieve hemostasis.   Fascia lata identified and divided by line of skin incision.  Paraspinous muscle elevated.  McCullough retractor was placed.  Operating microscope was draped, brought into the surgical field.  Confirmatory radiograph obtained.  Hemilaminotomy in the caudad edge of 4 was performed with 3-mm Kerrison and a micro osteotome preserving the pars __________ hypertrophic facet, noted very small interlaminar window.  Microcurette and straight curette was utilized to detach ligamentum flavum from the cephalad edge of L5.  A Woodson retractor was utilized to free adhesions of the neural elements from the hypertrophic facet.  Then, performed a foraminotomy with a 2-mm and a 3- mm Kerrison for the 5 root with severe compression of the 5 root in the lateral recess with compression by the facet hypertrophy.  We decompressed lateral recess to the medial border pedicle with a 2-mm Kerrison.  I performed foraminotomy of L4 as this was stenotic as well. The significant tethering of the 5 root due to the disk herniation as well.  With minimal mobilization of the nerve root though with nerve root protected, we found a disk herniation and made a small annulotomy and removed extruded fragment in the inferior aspect of the disk space with a micro nerve hook and a micropituitary.  There, however, following this, was still __________ of the nerve root.  By x-ray, the patient had some calcification of the disk and did have a portion of disk herniation that was calcified consistent with  chronicity of his previous disk herniation.  With neuroprotection, __________ to mobilize the hard disk into the disk space which was then retrieved with a pituitary and also in the subannular space.  This space was entered, and multiple small fragments were removed.  __________ meticulous diskectomy, there was no further tension or compression of the root noted.  We had 1 cm of excursion of the 5 root near the pedicle without  tension.  A Woodson retractor probe was then placed freely up the foramen of 4 and 5 beneath the root.  We checked beneath the root, the axilla of the root, and also the shoulder of the root without residual disk herniation noted. Copiously irrigated disk space with antibiotic irrigation and obtained confirmatory radiograph with the instrument above and below the disk space.  Inspection revealed no CSF leakage or active bleeding.  Placed thrombin-soaked Gelfoam in the laminotomy defect.  Removed the [facility] retractor.  No evidence of paraspinous bleeding.  Copiously irrigated the wound.  We then repaired the fascia with 1 Vicryl interrupted figure-of-eight sutures.  We had multiple layers of closure of the subcutaneous adipose tissue and closed the skin with 4-0 subcuticular Prolene.  Wound reinforced with Steri-Strips.  Sterile dressing applied.  We utilized the long retractors.  We utilized __________ due to the adipose tissue.  This increased the difficulty of the case.  Next, the patient was placed supine on hospital bed, extubated without difficulty, and transported to the recovery room in satisfactory condition.  The patient tolerated the procedure well.  No complications.  Assistant Lanna Poche, [place], required for gentle intermittent nerve root traction, suction, the patient positioning.  Minimal blood loss.     Jene Every, M.D.     Cordelia Pen  D:  02/23/2012  T:  02/23/2012  Job:  [id number]

## 2012-02-24 NOTE — Progress Notes (Signed)
   Subjective: 1 Day Post-Op Procedure(s) (LRB): MICRO-LUMBAR DECOMPRESSION  L4-L5 RIGHT     (Right) Patient reports pain as mild.   Patient seen in rounds with Dr. Darrelyn Hillock. Patient is well, and has had no acute complaints or problems. No issues overnight. Denies shortness of breath and chest pain. Walking with therapy this morning.  Plan is to go Home after hospital stay.  Objective: Vital signs in last 24 hours: Temp:  [97.5 F (36.4 C)-98.3 F (36.8 C)] 98.1 F (36.7 C) (02/22 0530) Pulse Rate:  [72-104] 87 (02/22 0530) Resp:  [10-20] 16 (02/22 0530) BP: (108-145)/(64-81) 145/81 mmHg (02/22 0530) SpO2:  [97 %-100 %] 97 % (02/22 0530) Weight:  [131.09 kg (289 lb)] 131.09 kg (289 lb) (02/21 1700)  Intake/Output from previous day:  Intake/Output Summary (Last 24 hours) at 02/24/12 9147 Last data filed at 02/24/12 8295  Gross per 24 hour  Intake   2790 ml  Output   1775 ml  Net   1015 ml     EXAM General - Patient is Alert and Oriented Extremity - Neurologically intact Neurovascular intact Dorsiflexion/Plantar flexion intact Dressing/Incision - clean, dry Motor Function - intact, moving foot and toes well on exam.   Past Medical History  Diagnosis Date  . Hypertension   . Asthma     Assessment/Plan: 1 Day Post-Op Procedure(s) (LRB): MICRO-LUMBAR DECOMPRESSION  L4-L5 RIGHT     (Right) Principal Problem:   HNP (herniated nucleus pulposus), lumbar  Estimated body mass index is 37.09 kg/(m^2) as calculated from the following:   Height as of this encounter: 6\' 2"  (1.88 m).   Weight as of this encounter: 131.09 kg (289 lb). Advance diet Up with therapy D/C IV fluids Discharge home   DVT Prophylaxis - Aspirin Weight-Bearing as tolerated  Patient doing very well this morning. Will plan for discharge home today. Follow up with Dr. Shelle Iron as instructed.   Zalika Tieszen LAUREN 02/24/2012, 8:33 AM

## 2012-02-24 NOTE — Progress Notes (Signed)
VASCULAR LAB PRELIMINARY  PRELIMINARY  PRELIMINARY  PRELIMINARY  Right lower extremity venous Doppler completed.    Preliminary report:  No DVT noted in the right lower extremity or in the left common femoral or femoral veins.  Damico Partin, RVT 02/24/2012, 11:02 AM

## 2012-02-24 NOTE — Evaluation (Signed)
Occupational Therapy Evaluation Patient Details Name: Robert Kaufman MRN: [id number] DOB: Nov 27, 1970 Today's Date: 02/24/2012 Time: 4098-1191 OT Time Calculation (min): 39 min  OT Assessment / Plan / Recommendation Clinical Impression  Pt presents to OT s/p back surgery.  All education complete regarding ADL activity and fback precautions    OT Assessment  Patient does not need any further OT services    Follow Up Recommendations  No OT follow up       Equipment Recommendations  None recommended by OT          Precautions / Restrictions Precautions Precautions: Back Restrictions Weight Bearing Restrictions: No       ADL  Upper Body Bathing: Simulated;Supervision/safety Where Assessed - Upper Body Bathing: Unsupported sitting Lower Body Bathing: Simulated;Minimal assistance Where Assessed - Lower Body Bathing: Unsupported sit to stand Upper Body Dressing: Performed;Set up Where Assessed - Upper Body Dressing: Unsupported sit to stand Lower Body Dressing: Performed;Minimal assistance;Other (comment) (with AE) Where Assessed - Lower Body Dressing: Unsupported sit to stand Toilet Transfer: Performed;Supervision/safety Toilet Transfer Method: Sit to Barista: Comfort height toilet Toileting - Clothing Manipulation and Hygiene: Performed;Supervision/safety Where Assessed - Toileting Clothing Manipulation and Hygiene: Standing Transfers/Ambulation Related to ADLs: Pt educated on back precautions with ADL activity, including kitchen and home management.  Pt able to verbalize back precautions with ADL activity ADL Comments: Reccomended pt obtain a reacher (long version) and a sock aide to encourage I and adhering to back precautions. Pt with limited tolerance of sitting, but does well in standing          Visit Information  Last OT Received On: 02/24/12 Assistance Needed: +1       Prior Functioning     Home Living Lives With: Spouse Available  Help at Discharge: Family Type of Home: House Home Access: Stairs to enter Secretary/administrator of Steps: 2 Entrance Stairs-Rails: None Home Layout: Two level Alternate Level Stairs-Number of Steps: 14 Alternate Level Stairs-Rails: Right Home Adaptive Equipment: None Prior Function Level of Independence: Independent Able to Take Stairs?: Yes Driving: Yes Vocation: Full time employment Communication Communication: No difficulties Dominant Hand: Right         Vision/Perception Vision - History Patient Visual Report: No change from baseline   Cognition  Cognition Overall Cognitive Status: Appears within functional limits for tasks assessed/performed Arousal/Alertness: Awake/alert Orientation Level: Appears intact for tasks assessed Behavior During Session: [facility] for tasks performed    Extremity/Trunk Assessment Right Upper Extremity Assessment RUE ROM/Strength/Tone: [facility] for tasks assessed Left Upper Extremity Assessment LUE ROM/Strength/Tone: WFL for tasks assessed Right Lower Extremity Assessment RLE ROM/Strength/Tone: [facility] for tasks assessed Left Lower Extremity Assessment LLE ROM/Strength/Tone: [facility] for tasks assessed     Mobility Bed Mobility Bed Mobility: Supine to Sit;Sit to Supine;Rolling Right;Rolling Left;Left Sidelying to Sit;Sit to Sidelying Left Rolling Right: 5: Supervision Rolling Left: 5: Supervision Left Sidelying to Sit: 4: Min guard Supine to Sit: 4: Min guard Sit to Supine: 4: Min guard;5: Supervision Sit to Sidelying Left: 4: Min guard;5: Supervision Details for Bed Mobility Assistance: min cues for technique and to complete roll to side Transfers Transfers: Sit to Stand;Stand to Sit Sit to Stand: 5: Supervision;From bed;From chair/3-in-1 Stand to Sit: 5: Supervision;To chair/3-in-1;To bed Details for Transfer Assistance: cues for use of UEs and avoidance of excess fwd flex           End of Session  Pt left sitting EOb with call bell within  reach  GO Functional Assessment  Tool Used: clinical observation Functional Limitation: Self care Self Care Current Status [phone]): At least 20 percent but less than 40 percent impaired, limited or restricted Self Care Goal Status ([id number]): At least 1 percent but less than 20 percent impaired, limited or restricted Self Care Discharge Status [phone]): At least 20 percent but less than 40 percent impaired, limited or restricted   Yutaka Holberg, Metro Kung 02/24/2012, 10:15 AM

## 2012-02-26 ENCOUNTER — Encounter (HOSPITAL_COMMUNITY): Payer: Self-pay | Admitting: Specialist

## 2012-02-27 NOTE — Discharge Summary (Signed)
Physician Discharge Summary   Patient ID: Robert Kaufman MRN: [id number] DOB/AGE: [birth year] 42 y.o.  Admit date: 02/23/2012 Discharge date: 02/27/2012  Primary Diagnosis:   hnp and stenosis   Admission Diagnoses:  Past Medical History  Diagnosis Date  . Hypertension   . Asthma    Discharge Diagnoses:   Principal Problem:   HNP (herniated nucleus pulposus), lumbar  Procedure:  Procedure(s) (LRB): MICRO-LUMBAR DECOMPRESSION  L4-L5 RIGHT     (Right)   Consults: None  HPI:  see H&P    Laboratory Data: Hospital Outpatient Visit on 02/22/2012  Component Date Value Range Status  . MRSA, PCR 02/22/2012 NEGATIVE  NEGATIVE Final  . Staphylococcus aureus 02/22/2012 POSITIVE* NEGATIVE Final   Comment:                                 The Xpert SA Assay (FDA                          approved for NASAL specimens                          in patients over 75 years of age),                          is one component of                          a comprehensive surveillance                          program.  Test performance has                          been validated by [employer] for patients greater                          than or equal to 36 year old.                          It is not intended                          to diagnose infection nor to                          guide or monitor treatment.   No results found for this basename: HGB,  in the last 72 hours No results found for this basename: WBC, RBC, HCT, PLT,  in the last 72 hours No results found for this basename: NA, K, CL, CO2, BUN, CREATININE, GLUCOSE, CALCIUM,  in the last 72 hours No results found for this basename: LABPT, INR,  in the last 72 hours  X-Rays:Dg Chest 2 View  02/22/2012  *RADIOLOGY REPORT*  Clinical Data: Preoperative evaluation for back surgery.  Right lower back pain.  History of hypertension  CHEST - 2 VIEW  Comparison: None.  Findings: Heart and mediastinal contours  are within normal limits. Lung fields  are clear with no signs of focal infiltrate or congestive failure.  No pleural fluid or significant peribronchial cuffing is seen.  Bony structures demonstrate mild degenerative change of the mid and lower thoracic spine and are otherwise intact.  IMPRESSION: No worrisome focal or acute cardiopulmonary abnormality noted   Original Report Authenticated By: Rhodia Albright, M.D.    Dg Lumbar Spine 2-3 Views  02/22/2012  *RADIOLOGY REPORT*  Clinical Data: Right-sided low back pain.  Preoperative evaluation.  LUMBAR SPINE - 2-3 VIEW  Comparison: Lumbar MRI 10/30/2008.  Findings: There are five non-rib bearing lumbar-type vertebral bodies.  There is slight narrowing of the intervertebral disc space at the level of L4-L5.  Moderate multilevel osteophyte formation is seen representing degenerative spondylosis.  No fracture, dislocation, or bony destruction is evident.  IMPRESSION: Changes of degenerative disc disease and degenerative spondylosis.   Original Report Authenticated By: Onalee Hua Call    Dg Spine Portable 1 View  02/23/2012  *RADIOLOGY REPORT*  Clinical Data: Surgical level at L4-5.  PORTABLE SPINE - 1 VIEW  Comparison: Intraoperative films from the same day.  Findings: Film #3 is labeled 02:35 p.m.  Surgical probes are directed at the L4-5 disc space.  Alignment is stable.  IMPRESSION: Intraoperative localization of the L4-5 disc space.   Original Report Authenticated By: Marin Roberts, M.D.    Dg Spine Portable 1 View  02/23/2012  *RADIOLOGY REPORT*  Clinical Data: Surgical level L4-5.  PORTABLE SPINE - 1 VIEW  Comparison: 02/22/2012  Findings: Posterior needles are noted, directed at the L5 S1 and L4- 5 levels.  IMPRESSION: Intraoperative localization as above.   Original Report Authenticated By: Charlett Nose, M.D.    Dg Spine Portable 1 View  02/23/2012  *RADIOLOGY REPORT*  Clinical Data: Back pain  PORTABLE SPINE - 1 VIEW  Comparison: Multiple priors.      Prior MRI 10/30/2008.  Findings: Portable film #2 demonstrates a slightly angled probe from a posterior approach directed most closely toward the pedicle of L5.  There is calcification of the annulus at L4-L5.  IMPRESSION: As above.   Original Report Authenticated By: Davonna Belling, M.D.     EKG: Orders placed during the hospital encounter of 02/22/12  . EKG 12-LEAD  . EKG 12-LEAD     Hospital Course: Patient was admitted to [facility] and taken to the OR and underwent the above state procedure without complications.  Patient tolerated the procedure well and was later transferred to the recovery room and then to the orthopaedic floor for postoperative care.  They were given PO and IV analgesics for pain control following their surgery.  They were given 24 hours of postoperative antibiotics.   PT was consulted postop to assist with mobility and transfers.  The patient was allowed to be WBAT with therapy and was taught back precautions. Discharge planning was consulted to help with postop disposition and equipment needs.  Patient had a good night on the evening of surgery and started to get up OOB with therapy on day one. Patient was seen in rounds and was ready to go home on day one.  They were given discharge instructions and dressing directions.  They were instructed on when to follow up in the office with Dr. Shelle Iron.  Discharge Medications: Prior to Admission medications   Medication Sig Start Date End Date Taking? Authorizing Provider  cetirizine (ZYRTEC ALLERGY) 10 MG tablet Take 10 mg by mouth daily.   Yes Historical Provider, MD  fluticasone (FLONASE) 50 MCG/ACT  nasal spray Place 1 spray into the nose daily.   Yes Historical Provider, MD  Fluticasone-Salmeterol (ADVAIR) 100-50 MCG/DOSE AEPB Inhale 1 puff into the lungs every 12 (twelve) hours.   Yes Historical Provider, MD  lisinopril-hydrochlorothiazide (PRINZIDE,ZESTORETIC) 20-25 MG per tablet Take 1 tablet by mouth daily before  breakfast.   Yes Historical Provider, MD  montelukast (SINGULAIR) 10 MG tablet Take 10 mg by mouth daily before breakfast.   Yes Historical Provider, MD  rosuvastatin (CRESTOR) 10 MG tablet Take 10 mg by mouth daily before breakfast.   Yes Historical Provider, MD  aspirin EC 81 MG tablet Take 81 mg by mouth daily.    Historical Provider, MD  meloxicam (MOBIC) 7.5 MG tablet Take 7.5 mg by mouth daily.    Historical Provider, MD  methocarbamol (ROBAXIN) 500 MG tablet Take 1 tablet (500 mg total) by mouth 3 (three) times daily. 02/23/12   Javier Docker, MD  Multiple Vitamin (MULTIVITAMIN WITH MINERALS) TABS Take 1 tablet by mouth daily.    Historical Provider, MD  oxyCODONE-acetaminophen (PERCOCET) 7.5-325 MG per tablet Take 1 tablet by mouth every 4 (four) hours as needed for pain. 02/23/12   Javier Docker, MD    Diet: Regular diet Activity:WBAT Follow-up:in 10-14 days Disposition - Home Discharged Condition: good   Discharge Orders   Future Orders Complete By Expires     Call MD / Call 911  As directed     Comments:      If you experience chest pain or shortness of breath, CALL 911 and be transported to the hospital emergency room.  If you develope a fever above 101 F, pus (white drainage) or increased drainage or redness at the wound, or calf pain, call your surgeon's office.    Constipation Prevention  As directed     Comments:      Drink plenty of fluids.  Prune juice may be helpful.  You may use a stool softener, such as Colace (over the counter) 100 mg twice a day.  Use MiraLax (over the counter) for constipation as needed.    Discharge instructions  As directed     Comments:      Change dressing daily after showers. May shower starting Monday.    Driving restrictions  As directed     Comments:      No driving    Increase activity slowly as tolerated  As directed     Lifting restrictions  As directed     Comments:      No lifting    Weight bearing as tolerated  As directed           Medication List    STOP taking these medications       HYDROcodone-acetaminophen 5-325 MG per tablet  Commonly known as:  NORCO/VICODIN      TAKE these medications       aspirin EC 81 MG tablet  Take 81 mg by mouth daily.     fluticasone 50 MCG/ACT nasal spray  Commonly known as:  FLONASE  Place 1 spray into the nose daily.     Fluticasone-Salmeterol 100-50 MCG/DOSE Aepb  Commonly known as:  ADVAIR  Inhale 1 puff into the lungs every 12 (twelve) hours.     lisinopril-hydrochlorothiazide 20-25 MG per tablet  Commonly known as:  PRINZIDE,ZESTORETIC  Take 1 tablet by mouth daily before breakfast.     meloxicam 7.5 MG tablet  Commonly known as:  MOBIC  Take 7.5 mg by mouth  daily.     methocarbamol 500 MG tablet  Commonly known as:  ROBAXIN  Take 1 tablet (500 mg total) by mouth 3 (three) times daily.     montelukast 10 MG tablet  Commonly known as:  SINGULAIR  Take 10 mg by mouth daily before breakfast.     multivitamin with minerals Tabs  Take 1 tablet by mouth daily.     oxyCODONE-acetaminophen 7.5-325 MG per tablet  Commonly known as:  PERCOCET  Take 1 tablet by mouth every 4 (four) hours as needed for pain.     rosuvastatin 10 MG tablet  Commonly known as:  CRESTOR  Take 10 mg by mouth daily before breakfast.     ZYRTEC ALLERGY 10 MG tablet  Generic drug:  cetirizine  Take 10 mg by mouth daily.           Follow-up Information   Follow up with BEANE,Antron C, MD In 10 days.   Contact information:   [address] [place] [address] [phone]       Signed: Dorothy Spark. 02/27/2012, 8:17 AM

## 2014-04-03 ENCOUNTER — Other Ambulatory Visit: Payer: Self-pay | Admitting: Family Medicine

## 2014-04-03 DIAGNOSIS — N50812 Left testicular pain: Secondary | ICD-10-CM

## 2014-04-08 ENCOUNTER — Ambulatory Visit
Admission: RE | Admit: 2014-04-08 | Discharge: 2014-04-08 | Disposition: A | Discharge status: Home / Self Care | Payer: 59 | Source: Ambulatory Visit | Attending: Family Medicine | Admitting: Family Medicine

## 2014-04-08 DIAGNOSIS — N50812 Left testicular pain: Secondary | ICD-10-CM

## 2017-12-12 ENCOUNTER — Ambulatory Visit: Payer: 59 | Admitting: Internal Medicine

## 2018-01-28 ENCOUNTER — Encounter: Payer: Self-pay | Admitting: Internal Medicine

## 2018-01-28 ENCOUNTER — Ambulatory Visit (INDEPENDENT_AMBULATORY_CARE_PROVIDER_SITE_OTHER): Payer: 59 | Admitting: Internal Medicine

## 2018-01-28 VITALS — BP 122/80 | HR 74 | Ht 74.0 in | Wt 311.8 lb

## 2018-01-28 DIAGNOSIS — E785 Hyperlipidemia, unspecified: Secondary | ICD-10-CM

## 2018-01-28 DIAGNOSIS — I10 Essential (primary) hypertension: Secondary | ICD-10-CM | POA: Diagnosis not present

## 2018-01-28 DIAGNOSIS — Z8249 Family history of ischemic heart disease and other diseases of the circulatory system: Secondary | ICD-10-CM

## 2018-01-28 NOTE — Patient Instructions (Addendum)
Medication Instructions:  Your physician recommends that you continue on your current medications as directed. Please refer to the Current Medication list given to you today.  If you need a refill on your cardiac medications before your next appointment, please call your pharmacy.   Lab work: None ordered If you have labs (blood work) drawn today and your tests are completely normal, you will receive your results only by: Marland Kitchen MyChart Message (if you have MyChart) OR . A paper copy in the mail If you have any lab test that is abnormal or we need to change your treatment, we will call you to review the results.  Testing/Procedures: Your physician has requested that you have an exercise tolerance test. For further information please visit [email or web address]. Please also follow instruction sheet, as given.  Your physician has requested that you have CT calcium score.     Follow-Up: At [facility], you and your health needs are our priority.  As part of our continuing mission to provide you with exceptional heart care, we have created designated Provider Care Teams.  These Care Teams include your primary Cardiologist (physician) and Advanced Practice Providers (APPs -  Physician Assistants and Nurse Practitioners) who all work together to provide you with the care you need, when you need it.  You will need a follow up appointment in 3 months.           Mediterranean Diet A Mediterranean diet refers to food and lifestyle choices that are based on the traditions of countries located on the [employer]. This way of eating has been shown to help prevent certain conditions and improve outcomes for people who have chronic diseases, like kidney disease and heart disease. What are tips for following this plan? Lifestyle  Cook and eat meals together with your family, when possible.  Drink enough fluid to keep your urine clear or pale yellow.  Be physically active every day. This  includes: ? Aerobic exercise like running or swimming. ? Leisure activities like gardening, walking, or housework.  Get 7-8 hours of sleep each night.  If recommended by your health care provider, drink red wine in moderation. This means 1 glass a day for nonpregnant women and 2 glasses a day for men. A glass of wine equals 5 oz (150 mL). Reading food labels   Check the serving size of packaged foods. For foods such as rice and pasta, the serving size refers to the amount of cooked product, not dry.  Check the total fat in packaged foods. Avoid foods that have saturated fat or trans fats.  Check the ingredients list for added sugars, such as corn syrup. Shopping  At the grocery store, buy most of your food from the areas near the walls of the store. This includes: ? Fresh fruits and vegetables (produce). ? Grains, beans, nuts, and seeds. Some of these may be available in unpackaged forms or large amounts (in bulk). ? Fresh seafood. ? Poultry and eggs. ? Low-fat dairy products.  Buy whole ingredients instead of prepackaged foods.  Buy fresh fruits and vegetables in-season from local farmers markets.  Buy frozen fruits and vegetables in resealable bags.  If you do not have access to quality fresh seafood, buy precooked frozen shrimp or canned fish, such as tuna, salmon, or sardines.  Buy small amounts of raw or cooked vegetables, salads, or olives from the deli or salad bar at your store.  Stock your pantry so you always have certain foods on hand, such as  olive oil, canned tuna, canned tomatoes, rice, pasta, and beans. Cooking  Cook foods with extra-virgin olive oil instead of using butter or other vegetable oils.  Have meat as a side dish, and have vegetables or grains as your main dish. This means having meat in small portions or adding small amounts of meat to foods like pasta or stew.  Use beans or vegetables instead of meat in common dishes like chili or  lasagna.  Experiment with different cooking methods. Try roasting or broiling vegetables instead of steaming or sauteing them.  Add frozen vegetables to soups, stews, pasta, or rice.  Add nuts or seeds for added healthy fat at each meal. You can add these to yogurt, salads, or vegetable dishes.  Marinate fish or vegetables using olive oil, lemon juice, garlic, and fresh herbs. Meal planning   Plan to eat 1 vegetarian meal one day each week. Try to work up to 2 vegetarian meals, if possible.  Eat seafood 2 or more times a week.  Have healthy snacks readily available, such as: ? Vegetable sticks with hummus. ? [place] yogurt. ? Fruit and nut trail mix.  Eat balanced meals throughout the week. This includes: ? Fruit: 2-3 servings a day ? Vegetables: 4-5 servings a day ? Low-fat dairy: 2 servings a day ? Fish, poultry, or lean meat: 1 serving a day ? Beans and legumes: 2 or more servings a week ? Nuts and seeds: 1-2 servings a day ? Whole grains: 6-8 servings a day ? Extra-virgin olive oil: 3-4 servings a day  Limit red meat and sweets to only a few servings a month What are my food choices?  Mediterranean diet ? Recommended ? Grains: Whole-grain pasta. Brown rice. Bulgar wheat. Polenta. Couscous. Whole-wheat bread. [place]. ? Vegetables: Artichokes. Beets. Broccoli. Cabbage. Carrots. Eggplant. Green beans. Chard. Kale. Spinach. Onions. Leeks. Peas. Squash. Tomatoes. Peppers. Radishes. ? Fruits: Apples. Apricots. Avocado. Berries. Bananas. Cherries. Dates. Figs. Grapes. Lemons. Melon. Oranges. Peaches. Plums. Pomegranate. ? Meats and other protein foods: Beans. Almonds. Sunflower seeds. Pine nuts. Peanuts. [place]. Salmon. Scallops. Shrimp. [place]. Tilapia. Clams. Oysters. Eggs. ? Dairy: Low-fat milk. Cheese. Greek yogurt. ? Beverages: Water. Red wine. Herbal tea. ? Fats and oils: Extra virgin olive oil. Avocado oil. Grape seed oil. ? Sweets and desserts: [place] yogurt with  honey. Baked apples. Poached pears. Trail mix. ? Seasoning and other foods: Basil. Cilantro. Coriander. Cumin. Mint. Parsley. Sage. Rosemary. Tarragon. Garlic. Oregano. Thyme. Pepper. Balsalmic vinegar. Tahini. Hummus. Tomato sauce. Olives. Mushrooms. ? Limit these ? Grains: Prepackaged pasta or rice dishes. Prepackaged cereal with added sugar. ? Vegetables: Deep fried potatoes (french fries). ? Fruits: Fruit canned in syrup. ? Meats and other protein foods: Beef. Pork. Lamb. Poultry with skin. Hot dogs. Berniece Salines. ? Dairy: Ice cream. Sour cream. Whole milk. ? Beverages: Juice. Sugar-sweetened soft drinks. Beer. Liquor and spirits. ? Fats and oils: Butter. Canola oil. Vegetable oil. Beef fat (tallow). Lard. ? Sweets and desserts: Cookies. Cakes. Pies. Candy. ? Seasoning and other foods: Mayonnaise. Premade sauces and marinades. ? The items listed may not be a complete list. Talk with your dietitian about what dietary choices are right for you. Summary  The Mediterranean diet includes both food and lifestyle choices.  Eat a variety of fresh fruits and vegetables, beans, nuts, seeds, and whole grains.  Limit the amount of red meat and sweets that you eat.  Talk with your health care provider about whether it is safe for you to drink red wine  in moderation. This means 1 glass a day for nonpregnant women and 2 glasses a day for men. A glass of wine equals 5 oz (150 mL). This information is not intended to replace advice given to you by your health care provider. Make sure you discuss any questions you have with your health care provider. Document Released: 08/12/2015 Document Revised: 09/14/2015 Document Reviewed: 08/12/2015 Elsevier Interactive Patient Education  2019 [place] DASH stands for "Dietary Approaches to Stop Hypertension." The DASH eating plan is a healthy eating plan that has been shown to reduce high blood pressure (hypertension). It may also reduce your  risk for type 2 diabetes, heart disease, and stroke. The DASH eating plan may also help with weight loss. What are tips for following this plan?  General guidelines  Avoid eating more than 2,300 mg (milligrams) of salt (sodium) a day. If you have hypertension, you may need to reduce your sodium intake to 1,500 mg a day.  Limit alcohol intake to no more than 1 drink a day for nonpregnant women and 2 drinks a day for men. One drink equals 12 oz of beer, 5 oz of wine, or 1 oz of hard liquor.  Work with your health care provider to maintain a healthy body weight or to lose weight. Ask what an ideal weight is for you.  Get at least 30 minutes of exercise that causes your heart to beat faster (aerobic exercise) most days of the week. Activities may include walking, swimming, or biking.  Work with your health care provider or diet and nutrition specialist (dietitian) to adjust your eating plan to your individual calorie needs. Reading food labels   Check food labels for the amount of sodium per serving. Choose foods with less than 5 percent of the Daily Value of sodium. Generally, foods with less than 300 mg of sodium per serving fit into this eating plan.  To find whole grains, look for the word "whole" as the first word in the ingredient list. Shopping  Buy products labeled as "low-sodium" or "no salt added."  Buy fresh foods. Avoid canned foods and premade or frozen meals. Cooking  Avoid adding salt when cooking. Use salt-free seasonings or herbs instead of table salt or sea salt. Check with your health care provider or pharmacist before using salt substitutes.  Do not fry foods. Cook foods using healthy methods such as baking, boiling, grilling, and broiling instead.  Cook with heart-healthy oils, such as olive, canola, soybean, or sunflower oil. Meal planning  Eat a balanced diet that includes: ? 5 or more servings of fruits and vegetables each day. At each meal, try to fill half of  your plate with fruits and vegetables. ? Up to 6-8 servings of whole grains each day. ? Less than 6 oz of lean meat, poultry, or fish each day. A 3-oz serving of meat is about the same size as a deck of cards. One egg equals 1 oz. ? 2 servings of low-fat dairy each day. ? A serving of nuts, seeds, or beans 5 times each week. ? Heart-healthy fats. Healthy fats called Omega-3 fatty acids are found in foods such as flaxseeds and coldwater fish, like sardines, salmon, and mackerel.  Limit how much you eat of the following: ? Canned or prepackaged foods. ? Food that is high in trans fat, such as fried foods. ? Food that is high in saturated fat, such as fatty meat. ? Sweets, desserts, sugary drinks, and  other foods with added sugar. ? Full-fat dairy products.  Do not salt foods before eating.  Try to eat at least 2 vegetarian meals each week.  Eat more home-cooked food and less restaurant, buffet, and fast food.  When eating at a restaurant, ask that your food be prepared with less salt or no salt, if possible. What foods are recommended? The items listed may not be a complete list. Talk with your dietitian about what dietary choices are best for you. Grains Whole-grain or whole-wheat bread. Whole-grain or whole-wheat pasta. Brown rice. [place]. Bulgur. Whole-grain and low-sodium cereals. Pita bread. Low-fat, low-sodium crackers. Whole-wheat flour tortillas. Vegetables Fresh or frozen vegetables (raw, steamed, roasted, or grilled). Low-sodium or reduced-sodium tomato and vegetable juice. Low-sodium or reduced-sodium tomato sauce and tomato paste. Low-sodium or reduced-sodium canned vegetables. Fruits All fresh, dried, or frozen fruit. Canned fruit in natural juice (without added sugar). Meat and other protein foods Skinless chicken or [place]. Ground chicken or [place]. Pork with fat trimmed off. Fish and seafood. Egg whites. Dried beans, peas, or lentils. Unsalted nuts, nut butters,  and seeds. Unsalted canned beans. Lean cuts of beef with fat trimmed off. Low-sodium, lean deli meat. Dairy Low-fat (1%) or fat-free (skim) milk. Fat-free, low-fat, or reduced-fat cheeses. Nonfat, low-sodium ricotta or cottage cheese. Low-fat or nonfat yogurt. Low-fat, low-sodium cheese. Fats and oils Soft margarine without trans fats. Vegetable oil. Low-fat, reduced-fat, or light mayonnaise and salad dressings (reduced-sodium). Canola, safflower, olive, soybean, and sunflower oils. Avocado. Seasoning and other foods Herbs. Spices. Seasoning mixes without salt. Unsalted popcorn and pretzels. Fat-free sweets. What foods are not recommended? The items listed may not be a complete list. Talk with your dietitian about what dietary choices are best for you. Grains Baked goods made with fat, such as croissants, muffins, or some breads. Dry pasta or rice meal packs. Vegetables Creamed or fried vegetables. Vegetables in a cheese sauce. Regular canned vegetables (not low-sodium or reduced-sodium). Regular canned tomato sauce and paste (not low-sodium or reduced-sodium). Regular tomato and vegetable juice (not low-sodium or reduced-sodium). Angie Fava. Olives. Fruits Canned fruit in a light or heavy syrup. Fried fruit. Fruit in cream or butter sauce. Meat and other protein foods Fatty cuts of meat. Ribs. Fried meat. Bernie[place] Salines. Sausage. Bologna and other processed lunch meats. Salami. Fatback. Hotdogs. Bratwurst. Salted nuts and seeds. Canned beans with added salt. Canned or smoked fish. Whole eggs or egg yolks. Chicken or [place] with skin. Dairy Whole or 2% milk, cream, and half-and-half. Whole or full-fat cream cheese. Whole-fat or sweetened yogurt. Full-fat cheese. Nondairy creamers. Whipped toppings. Processed cheese and cheese spreads. Fats and oils Butter. Stick margarine. Lard. Shortening. Ghee. Bacon fat. Tropical oils, such as coconut, palm kernel, or palm oil. Seasoning and other foods Salted popcorn  and pretzels. Onion salt, garlic salt, seasoned salt, table salt, and sea salt. Worcestershire sauce. Tartar sauce. Barbecue sauce. Teriyaki sauce. Soy sauce, including reduced-sodium. Steak sauce. Canned and packaged gravies. Fish sauce. Oyster sauce. Cocktail sauce. Horseradish that you find on the shelf. Ketchup. Mustard. Meat flavorings and tenderizers. Bouillon cubes. Hot sauce and Tabasco sauce. Premade or packaged marinades. Premade or packaged taco seasonings. Relishes. Regular salad dressings. Where to find more information:  National Heart, Lung, and [place]: https://wilson-eaton.com/  American Heart Association: www.heart.org Summary  The DASH eating plan is a healthy eating plan that has been shown to reduce high blood pressure (hypertension). It may also reduce your risk for type 2 diabetes, heart disease, and stroke.  With the DASH eating plan, you should limit salt (sodium) intake to 2,300 mg a day. If you have hypertension, you may need to reduce your sodium intake to 1,500 mg a day.  When on the DASH eating plan, aim to eat more fresh fruits and vegetables, whole grains, lean proteins, low-fat dairy, and heart-healthy fats.  Work with your health care provider or diet and nutrition specialist (dietitian) to adjust your eating plan to your individual calorie needs. This information is not intended to replace advice given to you by your health care provider. Make sure you discuss any questions you have with your health care provider. Document Released: 12/08/2010 Document Revised: 12/13/2015 Document Reviewed: 12/13/2015 Elsevier Interactive Patient Education  2019 [employer].

## 2018-01-28 NOTE — Progress Notes (Signed)
Cardiology Office Note:    Date:  01/28/2018   ID:  Craig Staggers, DOB 13-Oct-1970, MRN [id number]  PCP:  Aretta Nip, MD  Cardiologist:  No primary care provider on file.  Electrophysiologist:  None   Referring MD: Aretta Nip, MD   Assessment of cardiovascular risk factors  History of Present Illness:    Orlondo Holycross is a 48 y.o. male with a hx of asthma and hypertension who presents today for cardiovascular assessment given family history of heart disease and risk factors of hyperlipidemia, hypertension, obesity, and a family history of coronary artery disease with his father who had an initial MI at age 41.  Patient notes that at one point in the past he had a sensation in his chest which would last seconds to minutes and he attributed to anxiety, he had had a Holter monitor which was unrevealing.  He has not had significant symptoms recently and has no concerning cardiopulmonary symptoms at present.  He has sleep apnea and wears a CPAP.  His father had an MI in his 40s, and he has a grandfather who had an MI with sudden cardiac death.  Patient is quite concerned about silent MI and detecting coronary artery disease and would like to know his risk and burden of CAD so as to risk stratify himself over time.  He is a non-smoker, occasional alcohol consumption, and no recreational substance use.  His most recent cholesterol panel was in November 2019 which documents an HDL of 42, LDL of 59, total cholesterol 135, and triglycerides of 166.  His fasting glucose was normal at 81, and renal function is normal.  He has hypertension and takes lisinopril hydrochlorothiazide 20-25 mg, and for hypercholesterolemia takes rosuvastatin 10 mg daily.  The patient denies chest pain, chest pressure, dyspnea at rest or with exertion, palpitations, PND, orthopnea, or leg swelling. Denies syncope or presyncope. Denies dizziness or lightheadedness.  Denies fever, chills, cough, nausea,  vomiting, diarrhea, bleeding complications, urinary frequency or dysuria.  Denies malaise or unintended weight loss. Past Medical History:  Diagnosis Date  . Asthma   . Hypertension     Past Surgical History:  Procedure Laterality Date  . APPENDECTOMY    . DECOMPRESSIVE LUMBAR LAMINECTOMY LEVEL 1 Right 02/23/2012   Procedure: [facility] DECOMPRESSION  L4-L5 RIGHT    ;  Surgeon: Johnn Hai, MD;  Location: [facility];  Service: Orthopedics;  Laterality: Right;  MICRO-LUMBAR DECOMPRESSION  L4-L5 RIGHT        Current Medications: Current Meds  Medication Sig  . fluticasone (FLONASE) 50 MCG/ACT nasal spray Place 1 spray into the nose daily.  . Fluticasone-Salmeterol (ADVAIR) 100-50 MCG/DOSE AEPB Inhale 1 puff into the lungs every 12 (twelve) hours.  Marland Kitchen levocetirizine (XYZAL) 5 MG tablet Take 5 mg by mouth every evening.  Marland Kitchen lisinopril-hydrochlorothiazide (PRINZIDE,ZESTORETIC) 20-25 MG per tablet Take 1 tablet by mouth daily before breakfast.  . montelukast (SINGULAIR) 10 MG tablet Take 10 mg by mouth daily before breakfast.  . rosuvastatin (CRESTOR) 10 MG tablet Take 10 mg by mouth daily before breakfast.  . [DISCONTINUED] cetirizine (ZYRTEC ALLERGY) 10 MG tablet Take 10 mg by mouth daily.     Allergies:   Patient has no known allergies.   Social History   Socioeconomic History  . Marital status: Married    Spouse name: Not on file  . Number of children: Not on file  . Years of education: Not on file  . Highest education level: Not on file  Occupational History  . Not on file  Social Needs  . Financial resource strain: Not on file  . Food insecurity:    Worry: Not on file    Inability: Not on file  . Transportation needs:    Medical: Not on file    Non-medical: Not on file  Tobacco Use  . Smoking status: Never Smoker  . Smokeless tobacco: Never Used  Substance and Sexual Activity  . Alcohol use: Yes    Alcohol/week: 1.0 standard drinks    Types: 1 drink(s) per week  .  Drug use: No  . Sexual activity: Not on file  Lifestyle  . Physical activity:    Days per week: Not on file    Minutes per session: Not on file  . Stress: Not on file  Relationships  . Social connections:    Talks on phone: Not on file    Gets together: Not on file    Attends religious service: Not on file    Active member of club or organization: Not on file    Attends meetings of clubs or organizations: Not on file    Relationship status: Not on file  Other Topics Concern  . Not on file  Social History Narrative  . Not on file     Family History: The patient's family history includes Diabetes in his sister; Heart attack (age of onset: 18) in his father; Stroke (age of onset: 7) in his sister.    ROS:   Please see the history of present illness.    All other systems reviewed and are negative.  EKGs/Labs/Other Studies Reviewed:    The following studies were reviewed today:  EKG: Normal sinus rhythm, ventricular rate 74 bpm.  Likely movement artifact without definite ST-T wave abnormalities.  Recent Labs: No results found for requested labs within last 8760 hours.  Recent Lipid Panel No results found for: CHOL, TRIG, HDL, CHOLHDL, VLDL, LDLCALC, LDLDIRECT  Physical Exam:    VS:  BP 122/80   Pulse 74   Ht 6\' 2"  (1.88 m)   Wt (!) 311 lb 12.8 oz (141.4 kg)   BMI 40.03 kg/m     Wt Readings from Last 3 Encounters:  01/28/18 (!) 311 lb 12.8 oz (141.4 kg)  02/23/12 289 lb (131.1 kg)  02/22/12 289 lb (131.1 kg)     Constitutional: No acute distress Eyes: pupils equally round and reactive to light, sclera non-icteric, normal conjunctiva and lids ENMT: normal dentition, moist mucous membranes Cardiovascular: regular rhythm, normal rate, no murmurs. S1 and S2 normal. Radial pulses normal bilaterally. No jugular venous distention.  Respiratory: clear to auscultation bilaterally GI : normal bowel sounds, soft and nontender. No distention.   MSK: extremities warm, well  perfused. No edema.  NEURO: grossly nonfocal exam, moves all extremities. PSYCH: alert and oriented x 3, normal mood and affect.   ASSESSMENT:    1. Family history of early CAD   2. Essential hypertension   3. Hyperlipidemia, unspecified hyperlipidemia type    PLAN:     The patient is very interested in risk stratification for coronary artery disease given his family history and his risk factors.  Certainly with hypertension, hyperlipidemia, obesity, and a strong family history, he may be at risk for premature coronary artery disease.  Though he is asymptomatic he would like to know if he has ischemia and has requested stress testing.  I have counseled him on the probability of detecting disease when someone is asymptomatic, as well  as the possibility of an abnormality that would require further testing.  We participated in shared decision making.  Given his risk factors and symptoms in the past, he would like to be certain that he does not have exertional ischemia.  We will perform an exercise treadmill test.  In addition to re-stratify a coronary artery calcium score is appropriate to determine his risk and if intensification of his statin therapy is required.  We have discussed in great detail recommendations for optimal diet including the Mediterranean diet, and have also discussed best exercise recommendations.  We have also discussed risk factor modification such as treatment of hyperlipidemia weight loss, and continued treatment of hypertension.  His blood pressure is well controlled today.  We will follow-up test results and be in contact with the patient.  Medication Adjustments/Labs and Tests Ordered: Current medicines are reviewed at length with the patient today.  Concerns regarding medicines are outlined above.  Orders Placed This Encounter  Procedures  . CT CARDIAC SCORING  . Exercise Tolerance Test  . EKG 12-Lead   No orders of the defined types were placed in this  encounter.   Patient Instructions  Medication Instructions:  Your physician recommends that you continue on your current medications as directed. Please refer to the Current Medication list given to you today.  If you need a refill on your cardiac medications before your next appointment, please call your pharmacy.   Lab work: None ordered If you have labs (blood work) drawn today and your tests are completely normal, you will receive your results only by: Marland Kitchen MyChart Message (if you have MyChart) OR . A paper copy in the mail If you have any lab test that is abnormal or we need to change your treatment, we will call you to review the results.  Testing/Procedures: Your physician has requested that you have an exercise tolerance test. For further information please visit [email or web address]. Please also follow instruction sheet, as given.  Your physician has requested that you have CT calcium score.     Follow-Up: At [facility], you and your health needs are our priority.  As part of our continuing mission to provide you with exceptional heart care, we have created designated Provider Care Teams.  These Care Teams include your primary Cardiologist (physician) and Advanced Practice Providers (APPs -  Physician Assistants and Nurse Practitioners) who all work together to provide you with the care you need, when you need it.  You will need a follow up appointment in 3 months.           Mediterranean Diet A Mediterranean diet refers to food and lifestyle choices that are based on the traditions of countries located on the [employer]. This way of eating has been shown to help prevent certain conditions and improve outcomes for people who have chronic diseases, like kidney disease and heart disease. What are tips for following this plan? Lifestyle  Cook and eat meals together with your family, when possible.  Drink enough fluid to keep your urine clear or pale  yellow.  Be physically active every day. This includes: ? Aerobic exercise like running or swimming. ? Leisure activities like gardening, walking, or housework.  Get 7-8 hours of sleep each night.  If recommended by your health care provider, drink red wine in moderation. This means 1 glass a day for nonpregnant women and 2 glasses a day for men. A glass of wine equals 5 oz (150 mL). Reading food labels   Check  the serving size of packaged foods. For foods such as rice and pasta, the serving size refers to the amount of cooked product, not dry.  Check the total fat in packaged foods. Avoid foods that have saturated fat or trans fats.  Check the ingredients list for added sugars, such as corn syrup. Shopping  At the grocery store, buy most of your food from the areas near the walls of the store. This includes: ? Fresh fruits and vegetables (produce). ? Grains, beans, nuts, and seeds. Some of these may be available in unpackaged forms or large amounts (in bulk). ? Fresh seafood. ? Poultry and eggs. ? Low-fat dairy products.  Buy whole ingredients instead of prepackaged foods.  Buy fresh fruits and vegetables in-season from local farmers markets.  Buy frozen fruits and vegetables in resealable bags.  If you do not have access to quality fresh seafood, buy precooked frozen shrimp or canned fish, such as tuna, salmon, or sardines.  Buy small amounts of raw or cooked vegetables, salads, or olives from the deli or salad bar at your store.  Stock your pantry so you always have certain foods on hand, such as olive oil, canned tuna, canned tomatoes, rice, pasta, and beans. Cooking  Cook foods with extra-virgin olive oil instead of using butter or other vegetable oils.  Have meat as a side dish, and have vegetables or grains as your main dish. This means having meat in small portions or adding small amounts of meat to foods like pasta or stew.  Use beans or vegetables instead of meat  in common dishes like chili or lasagna.  Experiment with different cooking methods. Try roasting or broiling vegetables instead of steaming or sauteing them.  Add frozen vegetables to soups, stews, pasta, or rice.  Add nuts or seeds for added healthy fat at each meal. You can add these to yogurt, salads, or vegetable dishes.  Marinate fish or vegetables using olive oil, lemon juice, garlic, and fresh herbs. Meal planning   Plan to eat 1 vegetarian meal one day each week. Try to work up to 2 vegetarian meals, if possible.  Eat seafood 2 or more times a week.  Have healthy snacks readily available, such as: ? Vegetable sticks with hummus. ? [place] yogurt. ? Fruit and nut trail mix.  Eat balanced meals throughout the week. This includes: ? Fruit: 2-3 servings a day ? Vegetables: 4-5 servings a day ? Low-fat dairy: 2 servings a day ? Fish, poultry, or lean meat: 1 serving a day ? Beans and legumes: 2 or more servings a week ? Nuts and seeds: 1-2 servings a day ? Whole grains: 6-8 servings a day ? Extra-virgin olive oil: 3-4 servings a day  Limit red meat and sweets to only a few servings a month What are my food choices?  Mediterranean diet ? Recommended ? Grains: Whole-grain pasta. Brown rice. Bulgar wheat. Polenta. Couscous. Whole-wheat bread. [place]. ? Vegetables: Artichokes. Beets. Broccoli. Cabbage. Carrots. Eggplant. Green beans. Chard. Kale. Spinach. Onions. Leeks. Peas. Squash. Tomatoes. Peppers. Radishes. ? Fruits: Apples. Apricots. Avocado. Berries. Bananas. Cherries. Dates. Figs. Grapes. Lemons. Melon. Oranges. Peaches. Plums. Pomegranate. ? Meats and other protein foods: Beans. Almonds. Sunflower seeds. Pine nuts. Peanuts. [place]. Salmon. Scallops. Shrimp. [place]. Tilapia. Clams. Oysters. Eggs. ? Dairy: Low-fat milk. Cheese. Greek yogurt. ? Beverages: Water. Red wine. Herbal tea. ? Fats and oils: Extra virgin olive oil. Avocado oil. Grape seed oil. ? Sweets and  desserts: [place] yogurt with honey. Baked apples.  Poached pears. Trail mix. ? Seasoning and other foods: Basil. Cilantro. Coriander. Cumin. Mint. Parsley. Sage. Rosemary. Tarragon. Garlic. Oregano. Thyme. Pepper. Balsalmic vinegar. Tahini. Hummus. Tomato sauce. Olives. Mushrooms. ? Limit these ? Grains: Prepackaged pasta or rice dishes. Prepackaged cereal with added sugar. ? Vegetables: Deep fried potatoes (french fries). ? Fruits: Fruit canned in syrup. ? Meats and other protein foods: Beef. Pork. Lamb. Poultry with skin. Hot dogs. Berniece Salines. ? Dairy: Ice cream. Sour cream. Whole milk. ? Beverages: Juice. Sugar-sweetened soft drinks. Beer. Liquor and spirits. ? Fats and oils: Butter. Canola oil. Vegetable oil. Beef fat (tallow). Lard. ? Sweets and desserts: Cookies. Cakes. Pies. Candy. ? Seasoning and other foods: Mayonnaise. Premade sauces and marinades. ? The items listed may not be a complete list. Talk with your dietitian about what dietary choices are right for you. Summary  The Mediterranean diet includes both food and lifestyle choices.  Eat a variety of fresh fruits and vegetables, beans, nuts, seeds, and whole grains.  Limit the amount of red meat and sweets that you eat.  Talk with your health care provider about whether it is safe for you to drink red wine in moderation. This means 1 glass a day for nonpregnant women and 2 glasses a day for men. A glass of wine equals 5 oz (150 mL). This information is not intended to replace advice given to you by your health care provider. Make sure you discuss any questions you have with your health care provider. Document Released: 08/12/2015 Document Revised: 09/14/2015 Document Reviewed: 08/12/2015 Elsevier Interactive Patient Education  2019 [place] DASH stands for "Dietary Approaches to Stop Hypertension." The DASH eating plan is a healthy eating plan that has been shown to reduce high blood pressure  (hypertension). It may also reduce your risk for type 2 diabetes, heart disease, and stroke. The DASH eating plan may also help with weight loss. What are tips for following this plan?  General guidelines  Avoid eating more than 2,300 mg (milligrams) of salt (sodium) a day. If you have hypertension, you may need to reduce your sodium intake to 1,500 mg a day.  Limit alcohol intake to no more than 1 drink a day for nonpregnant women and 2 drinks a day for men. One drink equals 12 oz of beer, 5 oz of wine, or 1 oz of hard liquor.  Work with your health care provider to maintain a healthy body weight or to lose weight. Ask what an ideal weight is for you.  Get at least 30 minutes of exercise that causes your heart to beat faster (aerobic exercise) most days of the week. Activities may include walking, swimming, or biking.  Work with your health care provider or diet and nutrition specialist (dietitian) to adjust your eating plan to your individual calorie needs. Reading food labels   Check food labels for the amount of sodium per serving. Choose foods with less than 5 percent of the Daily Value of sodium. Generally, foods with less than 300 mg of sodium per serving fit into this eating plan.  To find whole grains, look for the word "whole" as the first word in the ingredient list. Shopping  Buy products labeled as "low-sodium" or "no salt added."  Buy fresh foods. Avoid canned foods and premade or frozen meals. Cooking  Avoid adding salt when cooking. Use salt-free seasonings or herbs instead of table salt or sea salt. Check with your health care provider or pharmacist before  using salt substitutes.  Do not fry foods. Cook foods using healthy methods such as baking, boiling, grilling, and broiling instead.  Cook with heart-healthy oils, such as olive, canola, soybean, or sunflower oil. Meal planning  Eat a balanced diet that includes: ? 5 or more servings of fruits and vegetables  each day. At each meal, try to fill half of your plate with fruits and vegetables. ? Up to 6-8 servings of whole grains each day. ? Less than 6 oz of lean meat, poultry, or fish each day. A 3-oz serving of meat is about the same size as a deck of cards. One egg equals 1 oz. ? 2 servings of low-fat dairy each day. ? A serving of nuts, seeds, or beans 5 times each week. ? Heart-healthy fats. Healthy fats called Omega-3 fatty acids are found in foods such as flaxseeds and [place] fish, like sardines, salmon, and mackerel.  Limit how much you eat of the following: ? Canned or prepackaged foods. ? Food that is high in trans fat, such as fried foods. ? Food that is high in saturated fat, such as fatty meat. ? Sweets, desserts, sugary drinks, and other foods with added sugar. ? Full-fat dairy products.  Do not salt foods before eating.  Try to eat at least 2 vegetarian meals each week.  Eat more home-cooked food and less restaurant, buffet, and fast food.  When eating at a restaurant, ask that your food be prepared with less salt or no salt, if possible. What foods are recommended? The items listed may not be a complete list. Talk with your dietitian about what dietary choices are best for you. Grains Whole-grain or whole-wheat bread. Whole-grain or whole-wheat pasta. Brown rice. [place]. Bulgur. Whole-grain and low-sodium cereals. Pita bread. Low-fat, low-sodium crackers. Whole-wheat flour tortillas. Vegetables Fresh or frozen vegetables (raw, steamed, roasted, or grilled). Low-sodium or reduced-sodium tomato and vegetable juice. Low-sodium or reduced-sodium tomato sauce and tomato paste. Low-sodium or reduced-sodium canned vegetables. Fruits All fresh, dried, or frozen fruit. Canned fruit in natural juice (without added sugar). Meat and other protein foods Skinless chicken or [place]. Ground chicken or [place]. Pork with fat trimmed off. Fish and seafood. Egg whites. Dried beans,  peas, or lentils. Unsalted nuts, nut butters, and seeds. Unsalted canned beans. Lean cuts of beef with fat trimmed off. Low-sodium, lean deli meat. Dairy Low-fat (1%) or fat-free (skim) milk. Fat-free, low-fat, or reduced-fat cheeses. Nonfat, low-sodium ricotta or cottage cheese. Low-fat or nonfat yogurt. Low-fat, low-sodium cheese. Fats and oils Soft margarine without trans fats. Vegetable oil. Low-fat, reduced-fat, or light mayonnaise and salad dressings (reduced-sodium). Canola, safflower, olive, soybean, and sunflower oils. Avocado. Seasoning and other foods Herbs. Spices. Seasoning mixes without salt. Unsalted popcorn and pretzels. Fat-free sweets. What foods are not recommended? The items listed may not be a complete list. Talk with your dietitian about what dietary choices are best for you. Grains Baked goods made with fat, such as croissants, muffins, or some breads. Dry pasta or rice meal packs. Vegetables Creamed or fried vegetables. Vegetables in a cheese sauce. Regular canned vegetables (not low-sodium or reduced-sodium). Regular canned tomato sauce and paste (not low-sodium or reduced-sodium). Regular tomato and vegetable juice (not low-sodium or reduced-sodium). Angie Fava. Olives. Fruits Canned fruit in a light or heavy syrup. Fried fruit. Fruit in cream or butter sauce. Meat and other protein foods Fatty cuts of meat. Ribs. Fried meat. Berniece Salines. Sausage. Bologna and other processed lunch meats. Salami. Fatback. Hotdogs. Bratwurst. Salted nuts and  seeds. Canned beans with added salt. Canned or smoked fish. Whole eggs or egg yolks. Chicken or [place] with skin. Dairy Whole or 2% milk, cream, and half-and-half. Whole or full-fat cream cheese. Whole-fat or sweetened yogurt. Full-fat cheese. Nondairy creamers. Whipped toppings. Processed cheese and cheese spreads. Fats and oils Butter. Stick margarine. Lard. Shortening. Ghee. Bacon fat. Tropical oils, such as coconut, palm kernel, or palm  oil. Seasoning and other foods Salted popcorn and pretzels. Onion salt, garlic salt, seasoned salt, table salt, and sea salt. Worcestershire sauce. Tartar sauce. Barbecue sauce. Teriyaki sauce. Soy sauce, including reduced-sodium. Steak sauce. Canned and packaged gravies. Fish sauce. Oyster sauce. Cocktail sauce. Horseradish that you find on the shelf. Ketchup. Mustard. Meat flavorings and tenderizers. Bouillon cubes. Hot sauce and Tabasco sauce. Premade or packaged marinades. Premade or packaged taco seasonings. Relishes. Regular salad dressings. Where to find more information:  National Heart, Lung, and [employer]: https://wilson-eaton.com/  American Heart Association: www.heart.org Summary  The DASH eating plan is a healthy eating plan that has been shown to reduce high blood pressure (hypertension). It may also reduce your risk for type 2 diabetes, heart disease, and stroke.  With the DASH eating plan, you should limit salt (sodium) intake to 2,300 mg a day. If you have hypertension, you may need to reduce your sodium intake to 1,500 mg a day.  When on the DASH eating plan, aim to eat more fresh fruits and vegetables, whole grains, lean proteins, low-fat dairy, and heart-healthy fats.  Work with your health care provider or diet and nutrition specialist (dietitian) to adjust your eating plan to your individual calorie needs. This information is not intended to replace advice given to you by your health care provider. Make sure you discuss any questions you have with your health care provider. Document Released: 12/08/2010 Document Revised: 12/13/2015 Document Reviewed: 12/13/2015 Elsevier Interactive Patient Education  2019 [place], Elouise Munroe, MD  01/28/2018 9:49 AM    [place]

## 2018-02-08 ENCOUNTER — Telehealth (HOSPITAL_COMMUNITY): Payer: Self-pay

## 2018-02-08 NOTE — Telephone Encounter (Signed)
Encounter complete. 

## 2018-02-11 ENCOUNTER — Encounter: Payer: Self-pay | Admitting: Internal Medicine

## 2018-02-11 DIAGNOSIS — E785 Hyperlipidemia, unspecified: Secondary | ICD-10-CM | POA: Insufficient documentation

## 2018-02-11 DIAGNOSIS — I10 Essential (primary) hypertension: Secondary | ICD-10-CM | POA: Insufficient documentation

## 2018-02-11 DIAGNOSIS — Z8249 Family history of ischemic heart disease and other diseases of the circulatory system: Secondary | ICD-10-CM | POA: Insufficient documentation

## 2018-02-12 ENCOUNTER — Telehealth (HOSPITAL_COMMUNITY): Payer: Self-pay

## 2018-02-12 NOTE — Telephone Encounter (Signed)
Encounter complete. 

## 2018-02-13 ENCOUNTER — Ambulatory Visit (HOSPITAL_COMMUNITY)
Admission: RE | Admit: 2018-02-13 | Discharge: 2018-02-13 | Disposition: A | Discharge status: Home / Self Care | Payer: 59 | Source: Ambulatory Visit | Attending: Cardiovascular Disease | Admitting: Cardiovascular Disease

## 2018-02-13 ENCOUNTER — Ambulatory Visit (INDEPENDENT_AMBULATORY_CARE_PROVIDER_SITE_OTHER)
Admission: RE | Admit: 2018-02-13 | Discharge: 2018-02-13 | Disposition: A | Discharge status: Home / Self Care | Payer: Self-pay | Source: Ambulatory Visit | Attending: Internal Medicine | Admitting: Internal Medicine

## 2018-02-13 DIAGNOSIS — I10 Essential (primary) hypertension: Secondary | ICD-10-CM | POA: Insufficient documentation

## 2018-02-13 DIAGNOSIS — E785 Hyperlipidemia, unspecified: Secondary | ICD-10-CM

## 2018-02-13 DIAGNOSIS — Z8249 Family history of ischemic heart disease and other diseases of the circulatory system: Secondary | ICD-10-CM | POA: Diagnosis not present

## 2018-02-13 LAB — EXERCISE TOLERANCE TEST
CSEPED: 7 min
CSEPHR: 94 %
Estimated workload: 9.3 METS
Exercise duration (sec): 30 s
MPHR: 173 {beats}/min
Peak HR: 164 {beats}/min
RPE: 20
Rest HR: 71 {beats}/min

## 2018-02-15 ENCOUNTER — Telehealth: Payer: Self-pay

## 2018-02-15 NOTE — Telephone Encounter (Signed)
Pt made aware of his ETT and CT calcium score results. Notes recorded by Elouise Munroe, MD on 02/15/2018 at 2:02 PM EST Stress test was low risk, and there are coronary artery calcifications. We do not need to do any further testing at this time. Would recommend increasing crestor dose to 20 mg for coronary artery disease noted by coronary artery calcifications. If there are additional questions please let me know or feel free to arrange a visit. If we didn't already set up follow up, have him see me in 6 months.  Pt sts that he will to call back to confirm his current Crestor dosage. He has an appt already scheduled with Dr.Acharya in April, he would like to keep that appt. Adv pt that once update we can send in a new prescription for the increased Crestor dosage.

## 2018-02-15 NOTE — Telephone Encounter (Signed)
-----   Message from Elouise Munroe, MD sent at 02/15/2018  2:02 PM EST ----- Stress test was low risk, and there are coronary artery calcifications. We do not need to do any further testing at this time. Would recommend increasing crestor dose to 20 mg for coronary artery disease noted by coronary artery calcifications. If there are additional questions please let me know or feel free to arrange a visit. If we didn't already set up follow up, have him see me in 6 months.

## 2018-02-15 NOTE — Telephone Encounter (Signed)
-----   Message from Elouise Munroe, MD sent at 02/15/2018  2:03 PM EST ----- Low risk stress test.

## 2018-02-18 ENCOUNTER — Telehealth: Payer: Self-pay

## 2018-02-18 NOTE — Telephone Encounter (Signed)
error 

## 2018-04-01 ENCOUNTER — Other Ambulatory Visit: Payer: Self-pay | Admitting: Internal Medicine

## 2018-04-04 ENCOUNTER — Telehealth: Payer: Self-pay

## 2018-04-04 NOTE — Telephone Encounter (Signed)
   Cardiac Questionnaire:    Since your last visit or hospitalization:    1. Have you been having new or worsening chest pain? NO   2. Have you been having new or worsening shortness of breath? NO 3. Have you been having new or worsening leg swelling, wt gain, or increase in abdominal girth (pants fitting more tightly)? NO   4. Have you had any passing out spells? NO    Patient will be reschedule for 12 weeks.

## 2018-04-23 ENCOUNTER — Ambulatory Visit: Payer: 59 | Admitting: Internal Medicine

## 2018-04-26 NOTE — Telephone Encounter (Signed)
Called pt to schedule appt. No answer, no VM.  Rosaria Ferries, PA-C 04/26/2018 4:39 PM Bee[facility] [phone]

## 2018-07-11 ENCOUNTER — Telehealth: Payer: Self-pay | Admitting: Internal Medicine

## 2018-07-11 NOTE — Telephone Encounter (Signed)
I call pt to confirm his appt on 07-12-18 with D rAcharya.       1. Confirm consent - "In the setting of the current Covid19 crisis, you are scheduled for a (phone or video) visit with your provider on (date) at (time).  Just as we do with many in-office visits, in order for you to participate in this visit, we must obtain consent.  If you'd like, I can send this to your mychart (if signed up) or email for you to review.  Otherwise, I can obtain your verbal consent now.  All virtual visits are billed to your insurance company just like a normal visit would be.  By agreeing to a virtual visit, we'd like you to understand that the technology does not allow for your provider to perform an examination, and thus may limit your provider's ability to fully assess your condition. If your provider identifies any concerns that need to be evaluated in person, we will make arrangements to do so.  Finally, though the technology is pretty good, we cannot assure that it will always work on either your or our end, and in the setting of a video visit, we may have to convert it to a phone-only visit.  In either situation, we cannot ensure that we have a secure connection.  Are you willing to proceed?" STAFF: Did the patient verbally acknowledge consent to telehealth visit? Document YES/NO here: Yes  t.     FULL LENGTH CONSENT FOR TELE-HEALTH VISIT   I hereby voluntarily request, consent and authorize [facility] and its employed or contracted physicians, physician assistants, nurse practitioners or other licensed health care professionals (the Practitioner), to provide me with telemedicine health care services (the Services") as deemed necessary by the treating Practitioner. I acknowledge and consent to receive the Services by the Practitioner via telemedicine. I understand that the telemedicine visit will involve communicating with the Practitioner through live audiovisual communication technology and the  disclosure of certain medical information by electronic transmission. I acknowledge that I have been given the opportunity to request an in-person assessment or other available alternative prior to the telemedicine visit and am voluntarily participating in the telemedicine visit.  I understand that I have the right to withhold or withdraw my consent to the use of telemedicine in the course of my care at any time, without affecting my right to future care or treatment, and that the Practitioner or I may terminate the telemedicine visit at any time. I understand that I have the right to inspect all information obtained and/or recorded in the course of the telemedicine visit and may receive copies of available information for a reasonable fee.  I understand that some of the potential risks of receiving the Services via telemedicine include:   Delay or interruption in medical evaluation due to technological equipment failure or disruption;  Information transmitted may not be sufficient (e.g. poor resolution of images) to allow for appropriate medical decision making by the Practitioner; and/or   In rare instances, security protocols could fail, causing a breach of personal health information.  Furthermore, I acknowledge that it is my responsibility to provide information about my medical history, conditions and care that is complete and accurate to the best of my ability. I acknowledge that Practitioner's advice, recommendations, and/or decision may be based on factors not within their control, such as incomplete or inaccurate data provided by me or distortions of diagnostic images or specimens that may result from electronic transmissions. I understand that the  practice of medicine is not an Chief Strategy Officer and that Practitioner makes no warranties or guarantees regarding treatment outcomes. I acknowledge that I will receive a copy of this consent concurrently upon execution via email to the email address I  last provided but may also request a printed copy by calling the office of [facility].    I understand that my insurance will be billed for this visit.   I have read or had this consent read to me.  I understand the contents of this consent, which adequately explains the benefits and risks of the Services being provided via telemedicine.   I have been provided ample opportunity to ask questions regarding this consent and the Services and have had my questions answered to my satisfaction.  I give my informed consent for the services to be provided through the use of telemedicine in my medical care  By participating in this telemedicine visit I agree to the above.

## 2018-07-12 ENCOUNTER — Telehealth: Payer: 59 | Admitting: Internal Medicine

## 2018-07-12 ENCOUNTER — Telehealth: Payer: Self-pay

## 2018-07-12 NOTE — Telephone Encounter (Signed)
Left message for patient to give the office a call to make appointment.

## 2018-08-01 ENCOUNTER — Emergency Department (HOSPITAL_COMMUNITY)
Admission: EM | Admit: 2018-08-01 | Discharge: 2018-08-01 | Disposition: A | Discharge status: Home / Self Care | Payer: 59 | Attending: Emergency Medicine | Admitting: Emergency Medicine

## 2018-08-01 ENCOUNTER — Emergency Department (HOSPITAL_COMMUNITY): Payer: 59

## 2018-08-01 ENCOUNTER — Encounter (HOSPITAL_COMMUNITY): Payer: Self-pay | Admitting: Student

## 2018-08-01 ENCOUNTER — Other Ambulatory Visit: Payer: Self-pay

## 2018-08-01 DIAGNOSIS — Z79899 Other long term (current) drug therapy: Secondary | ICD-10-CM | POA: Diagnosis not present

## 2018-08-01 DIAGNOSIS — I10 Essential (primary) hypertension: Secondary | ICD-10-CM | POA: Insufficient documentation

## 2018-08-01 DIAGNOSIS — Y92002 Bathroom of unspecified non-institutional (private) residence single-family (private) house as the place of occurrence of the external cause: Secondary | ICD-10-CM | POA: Diagnosis not present

## 2018-08-01 DIAGNOSIS — Y999 Unspecified external cause status: Secondary | ICD-10-CM | POA: Diagnosis not present

## 2018-08-01 DIAGNOSIS — R0781 Pleurodynia: Secondary | ICD-10-CM

## 2018-08-01 DIAGNOSIS — Y9389 Activity, other specified: Secondary | ICD-10-CM | POA: Insufficient documentation

## 2018-08-01 DIAGNOSIS — R55 Syncope and collapse: Secondary | ICD-10-CM | POA: Diagnosis present

## 2018-08-01 DIAGNOSIS — J45909 Unspecified asthma, uncomplicated: Secondary | ICD-10-CM | POA: Diagnosis not present

## 2018-08-01 DIAGNOSIS — R5383 Other fatigue: Secondary | ICD-10-CM | POA: Insufficient documentation

## 2018-08-01 DIAGNOSIS — W01198A Fall on same level from slipping, tripping and stumbling with subsequent striking against other object, initial encounter: Secondary | ICD-10-CM | POA: Diagnosis not present

## 2018-08-01 LAB — BASIC METABOLIC PANEL
Anion gap: 10 (ref 5–15)
BUN: 10 mg/dL (ref 6–20)
CO2: 28 mmol/L (ref 22–32)
Calcium: 9.3 mg/dL (ref 8.9–10.3)
Chloride: 99 mmol/L (ref 98–111)
Creatinine, Ser: 1.08 mg/dL (ref 0.61–1.24)
GFR calc Af Amer: >60 mL/min (ref >60)
GFR calc non Af Amer: >60 mL/min (ref >60)
Glucose, Bld: 102 mg/dL — ABNORMAL HIGH (ref 70–99)
Potassium: 3.7 mmol/L (ref 3.5–5.1)
Sodium: 137 mmol/L (ref 135–145)

## 2018-08-01 LAB — CBC
HCT: 45.5 % (ref 39.0–52.0)
Hemoglobin: 15.1 g/dL (ref 13.0–17.0)
MCH: 28.5 pg (ref 26.0–34.0)
MCHC: 33.2 g/dL (ref 30.0–36.0)
MCV: 86 fL (ref 80.0–100.0)
Platelets: 205 10*3/uL (ref 150–400)
RBC: 5.29 MIL/uL (ref 4.22–5.81)
RDW: 13.3 % (ref 11.5–15.5)
WBC: 12.2 10*3/uL — ABNORMAL HIGH (ref 4.0–10.5)
nRBC: 0 % (ref 0.0–0.2)

## 2018-08-01 LAB — URINALYSIS, ROUTINE W REFLEX MICROSCOPIC
Bacteria, UA: NONE SEEN
Bilirubin Urine: NEGATIVE
Glucose, UA: NEGATIVE mg/dL
Ketones, ur: NEGATIVE mg/dL
Leukocytes,Ua: NEGATIVE
Nitrite: NEGATIVE
Protein, ur: 100 mg/dL — AB
Specific Gravity, Urine: 1.015 (ref 1.005–1.030)
pH: 6 (ref 5.0–8.0)

## 2018-08-01 MED ORDER — KETOROLAC TROMETHAMINE 15 MG/ML IJ SOLN
15.0000 mg | Freq: Once | INTRAMUSCULAR | Status: AC
  Administered 2018-08-01: 15 mg via INTRAVENOUS
  Filled 2018-08-01: qty 1

## 2018-08-01 MED ORDER — NAPROXEN 500 MG PO TABS: 500.0000 mg | ORAL_TABLET | Freq: Two times a day (BID) | ORAL | 0 refills | Status: DC

## 2018-08-01 MED ORDER — SODIUM CHLORIDE 0.9 % IV BOLUS
1000.0000 mL | Freq: Once | INTRAVENOUS | Status: AC
  Administered 2018-08-01: 1000 mL via INTRAVENOUS

## 2018-08-01 MED ORDER — ACETAMINOPHEN 325 MG PO TABS
650.0000 mg | ORAL_TABLET | Freq: Once | ORAL | Status: AC
  Administered 2018-08-01: 08:00:00 650 mg via ORAL
  Filled 2018-08-01: qty 2

## 2018-08-01 NOTE — ED Triage Notes (Signed)
Pt c/o syncope x2. States he fell into the tub on his left side and believes that he may have "cracked a rib"

## 2018-08-01 NOTE — Discharge Instructions (Addendum)
You are seen in the emergency department today after passing out twice throughout the evening.  Your work-up in the ER was overall reassuring.  Your blood work did not show significant abnormalities, there was a mild amount of blood and protein in your urine, this will need to be rechecked by primary care within 1 to 2 weeks.  Your EKG and heart monitor were reassuring. Your chest x-ray did not show signs of fracture, dislocation, or problems with your underlying lung  Given your normal blood pressures in the ER with passing out we recommend not taking your blood pressure medicine today, call your primary care this afternoon to discuss this.    Sending home with incentive spirometer, assistive device to help prevent complications related to pain in the rib area, please use this 4-6 times per day for the next 2 to 3 weeks.  We are sending home with naproxen to help with the pain as well.  - Naproxen is a nonsteroidal anti-inflammatory medication that will help with pain and swelling. Be sure to take this medication as prescribed with food, 1 pill every 12 hours,  It should be taken with food, as it can cause stomach upset, and more seriously, stomach bleeding. Do not take other nonsteroidal anti-inflammatory medications with this such as Advil, Motrin, Aleve, Mobic, Goodie Powder, or Motrin.    You make take Tylenol per over the counter dosing with these medications.   We have prescribed you new medication(s) today. Discuss the medications prescribed today with your pharmacist as they can have adverse effects and interactions with your other medicines including over the counter and prescribed medications. Seek medical evaluation if you start to experience new or abnormal symptoms after taking one of these medicines, seek care immediately if you start to experience difficulty breathing, feeling of your throat closing, facial swelling, or rash as these could be indications of a more serious allergic  reaction   Please follow-up closely with your primary care provider, call this afternoon, and be sure to have an appointment within the next 5 days. Return to the ER for new or worsening symptoms including but not limited to recurrence of passing out, chest pain, trouble breathing, productive cough, fever, or any other concerns.

## 2018-08-01 NOTE — ED Provider Notes (Signed)
[facility] EMERGENCY DEPARTMENT Provider Note   CSN: [id number] Arrival date & time: 08/01/18  0500     History   Chief Complaint Chief Complaint  Patient presents with  . Loss of Consciousness    HPI Robert Kaufman is a 48 y.o. male with a hx of asthma, HTN, & hyperlipidemia who presents to the ED s/p syncope x 2 shortly after 03:30 this AM. Patient reports that he woke from sleep around 3:30, went downstairs & was seated on the couch looking at his phone, when he stood up to walk to the restroom he felt a bit lightheaded as if he may pass out, but was able to steady himself & get to the bathroom. While urinating in the standing position the lightheadedness returned, increased, & he subsequently passed out. He states he came back to & things were a bit fuzzy, but he believes he tried to standup & passed out a 2nd time- this time falling into the bathtub on his L ribs. He does not believe he hit his had at any point in time.  His wife came to check on him almost immediately. He was able to get up & has been ambulatory since. He states currently he feels mostly tired from lack of sleep & is having pain to the L anterior ribs that is moderate in severity, worse with movement, no alleviating factors. No other complaints of pain. Denies chest pain, dyspnea, nausea, vomiting, diarrhea, melena, constipation, abdominal pain, headache, neck pain, back pain, numbness, or focal weakness. No recent med changes. Yesterday he did have a lot of sugary products, but otherwise ate/drank normally. He typically drinks a lot of tea as opposed to water. No prior hx of syncope. Denies personal hx of any heart problems, family hx of CAD- he had recent stress test & CT coronary testing which was normal.      HPI  Past Medical History:  Diagnosis Date  . Asthma   . Hypertension     Patient Active Problem List   Diagnosis Date Noted  . Family history of early CAD 02/11/2018  . Essential  hypertension 02/11/2018  . Hyperlipidemia 02/11/2018  . HNP (herniated nucleus pulposus), lumbar 02/23/2012    Past Surgical History:  Procedure Laterality Date  . APPENDECTOMY    . DECOMPRESSIVE LUMBAR LAMINECTOMY LEVEL 1 Right 02/23/2012   Procedure: [facility] DECOMPRESSION  L4-L5 RIGHT    ;  Surgeon: Johnn Hai, MD;  Location: [facility] ORS;  Service: Orthopedics;  Laterality: Right;  MICRO-LUMBAR DECOMPRESSION  L4-L5 RIGHT            Home Medications    Prior to Admission medications   Medication Sig Start Date End Date Taking? Authorizing Provider  fluticasone (FLONASE) 50 MCG/ACT nasal spray Place 1 spray into the nose daily.    [provider]  Fluticasone-Salmeterol (ADVAIR) 100-50 MCG/DOSE AEPB Inhale 1 puff into the lungs every 12 (twelve) hours.    [provider]  levocetirizine (XYZAL) 5 MG tablet Take 5 mg by mouth every evening.    [provider]  lisinopril-hydrochlorothiazide (PRINZIDE,ZESTORETIC) 20-25 MG per tablet Take 1 tablet by mouth daily before breakfast.    [provider]  montelukast (SINGULAIR) 10 MG tablet Take 10 mg by mouth daily before breakfast.    [provider]  rosuvastatin (CRESTOR) 10 MG tablet Take 10 mg by mouth daily before breakfast.    [provider]    Family History Family History  Problem Relation  Age of Onset  . Heart attack Father 52  . Lung disease Father 69  . Stroke Sister 28  . Diabetes Sister 84  . Diabetes Mother 26  . Dementia Maternal Grandmother 76  . Alzheimer's disease Maternal Grandmother   . Heart attack Maternal Grandfather 60  . Dementia Paternal Grandmother 49  . Heart defect Paternal Grandfather 81    Social History Social History   Tobacco Use  . Smoking status: Never Smoker  . Smokeless tobacco: Never Used  Substance Use Topics  . Alcohol use: Yes    Alcohol/week: 1.0 standard drinks    Types: 1 drink(s) per week  . Drug use: No      Allergies   Patient has no known allergies.   Review of Systems Review of Systems  Constitutional: Negative for chills and fever.       + for being tired  Respiratory: Negative for shortness of breath.   Cardiovascular: Negative for chest pain.  Gastrointestinal: Negative for abdominal pain, blood in stool, diarrhea, nausea and vomiting.  Genitourinary: Negative for dysuria.  Musculoskeletal: Negative for back pain and neck pain.       + for left anterior rib pain.   Neurological: Positive for syncope and light-headedness. Negative for tremors, seizures, facial asymmetry, speech difficulty, weakness, numbness and headaches.  All other systems reviewed and are negative.  Physical Exam Updated Vital Signs BP 114/68 (BP Location: Right Arm)   Pulse 75   Temp 98.5 F (36.9 C) (Oral)   Resp 20   SpO2 100%   Physical Exam Vitals signs and nursing note reviewed.  Constitutional:      General: He is not in acute distress.    Appearance: He is well-developed. He is not toxic-appearing.  HENT:     Head: Normocephalic and atraumatic. No raccoon eyes, Battle's sign, contusion, right periorbital erythema or left periorbital erythema.     Right Ear: No hemotympanum.     Left Ear: No hemotympanum.     Mouth/Throat:     Pharynx: Uvula midline.  Eyes:     General:        Right eye: No discharge.        Left eye: No discharge.     Extraocular Movements: Extraocular movements intact.     Conjunctiva/sclera: Conjunctivae normal.     Pupils: Pupils are equal, round, and reactive to light.  Neck:     Musculoskeletal: Normal range of motion and neck supple. No spinous process tenderness.  Cardiovascular:     Rate and Rhythm: Normal rate and regular rhythm.     Pulses:          Radial pulses are 2+ on the right side and 2+ on the left side.  Pulmonary:     Effort: Pulmonary effort is normal. No respiratory distress.     Breath sounds: Normal breath sounds. No wheezing, rhonchi or rales.   Chest:     Comments: Left lower anterior chest wall tenderness to palpation without overlying ecchymosis/abrasion or palpable crepitus.  Abdominal:     General: There is no distension.     Palpations: Abdomen is soft.     Tenderness: There is no abdominal tenderness. There is no right CVA tenderness, left CVA tenderness, guarding or rebound.  Musculoskeletal:     Comments: No obvious deformity, appreciable swelling, erythema, significant bruising, or significant open wounds.  Upper/Lower extremities: Intact AROM throughout. No point/focal bony tenderness.  Back: No midline tenderness to palpation or palpable step  off.   Skin:    General: Skin is warm and dry.     Findings: No rash.  Neurological:     Mental Status: He is alert.     Comments: Clear speech.   Psychiatric:        Behavior: Behavior normal.    ED Treatments / Results  Labs (all labs ordered are listed, but only abnormal results are displayed) Labs Reviewed  URINALYSIS, ROUTINE W REFLEX MICROSCOPIC - Abnormal; Notable for the following components:      Result Value   APPearance HAZY (*)    Hgb urine dipstick SMALL (*)    Protein, ur 100 (*)    All other components within normal limits  BASIC METABOLIC PANEL  CBC    EKG EKG Interpretation  Date/Time:  Thursday August 01 2018 05:27:42 EDT Ventricular Rate:  73 PR Interval:  176 QRS Duration: 98 QT Interval:  414 QTC Calculation: 456 R Axis:   3 Text Interpretation:  Normal sinus rhythm Low voltage QRS Borderline ECG No old tracing to compare Confirmed by Malvin Johns [phone]) on 08/01/2018 9:07:08 AM   Radiology Dg Chest 2 View  Result Date: 08/01/2018 CLINICAL DATA:  Right rib pain EXAM: CHEST - 2 VIEW COMPARISON:  02/22/2012 FINDINGS: The heart size and mediastinal contours are within normal limits. Both lungs are clear. The visualized skeletal structures are unremarkable. IMPRESSION: Negative chest. Electronically Signed   By: Monte Fantasia M.D.   On:  08/01/2018 06:19    Procedures Procedures (including critical care time)  Medications Ordered in ED Medications - No data to display   Initial Impression / Assessment and Plan / ED Course  I have reviewed the triage vital signs and the nursing notes.  Pertinent labs & imaging results that were available during my care of the patient were reviewed by me and considered in my medical decision making (see chart for details).   Patient presents to the ED s/p syncopal episode x 2 with pain to L lower ribs s/p injury. Patient is nontoxic appearing, in no apparent distress, vitals WNL. Exam w/o signs of serious head/neck/back injury- do not feel CT imaging is necessary of head/neck per [place] CT rules. No midline spinal tenderness or neuro deficits. He does have some L anterior lower rib tenderness to palpation w/o overlying bruising or palpable crepitus- CXR per triage team negative - discussed dedicated L rib views & patient has declined. Abdomen is nontender- do not suspect significant intra-thoracic/abdominal trauma.     Work-up per triage reviewed:  CBC: No anemia. Mild leukocytosis felt to be non specific.  BMP: No significant electrolyte derangement. Renal function preserved.  UA: Proteinuria & hematuria- will need PCP recheck of each of these findings.  CXR: Negative- no infiltrate, ptx, or fracture/dislocation  EKG: NSR, low voltage, no STEMI.   Orthostatic VS for the past 24 hrs:  BP- Lying Pulse- Lying BP- Sitting Pulse- Sitting BP- Standing at 0 minutes Pulse- Standing at 0 minutes  08/01/18 0829 116/61 66 110/67 74 110/48 82    Orthostatic vital signs fairly unremarkable.   Cardiac monitor reviewed: No significant tachycardia or arrhythmias.   Patient ambulatory in the ED w/o difficulty.  Syncope possibly vasovagal given urination with passing out- unclear definitive etiology. His pressures are normal here in the ER- we discussed holding his BP meds today & discussing with his  PCP.  He has PCP & cardiology to follow up with outpatient. Will DC with naproxen & incentive spirometer  I discussed results, treatment plan, need for follow-up, and return precautions with the patient. Provided opportunity for questions, patient confirmed understanding and is in agreement with plan.    Final Clinical Impressions(s) / ED Diagnoses   Final diagnoses:  Rib pain  Syncope, unspecified syncope type    ED Discharge Orders         Ordered    naproxen (NAPROSYN) 500 MG tablet  2 times daily     08/01/18 [address] [place] R, PA-C 08/01/18 1206    Malvin Johns, MD 08/01/18 1447

## 2018-08-12 ENCOUNTER — Telehealth: Payer: 59 | Admitting: Internal Medicine

## 2018-12-23 ENCOUNTER — Ambulatory Visit: Payer: 59 | Attending: Internal Medicine

## 2018-12-23 DIAGNOSIS — U071 COVID-19: Secondary | ICD-10-CM

## 2018-12-24 ENCOUNTER — Other Ambulatory Visit: Payer: 59

## 2018-12-24 LAB — NOVEL CORONAVIRUS, NAA: SARS-CoV-2, NAA: NOT DETECTED

## 2019-10-18 ENCOUNTER — Ambulatory Visit: Payer: 59 | Attending: Internal Medicine

## 2019-10-18 DIAGNOSIS — Z23 Encounter for immunization: Secondary | ICD-10-CM

## 2019-10-18 NOTE — Progress Notes (Signed)
   Covid-19 Vaccination Clinic  Name:  Robert Kaufman    MRN: [id number] DOB: Apr 17, 1970  10/18/2019  Mr. Robert Kaufman was observed post Covid-19 immunization for 15 minutes without incident. He was provided with Vaccine Information Sheet and instruction to access the V-Safe system.   Mr. Robert Kaufman was instructed to call 911 with any severe reactions post vaccine: Marland Kitchen Difficulty breathing  . Swelling of face and throat  . A fast heartbeat  . A bad rash all over body  . Dizziness and weakness

## 2020-06-04 ENCOUNTER — Ambulatory Visit (AMBULATORY_SURGERY_CENTER): Payer: 59 | Admitting: *Deleted

## 2020-06-04 ENCOUNTER — Other Ambulatory Visit: Payer: Self-pay

## 2020-06-04 VITALS — Ht 74.0 in | Wt 315.0 lb

## 2020-06-04 DIAGNOSIS — Z1211 Encounter for screening for malignant neoplasm of colon: Secondary | ICD-10-CM

## 2020-06-04 MED ORDER — NA SULFATE-K SULFATE-MG SULF 17.5-3.13-1.6 GM/177ML PO SOLN: ORAL | 0 refills | Status: DC

## 2020-06-04 NOTE — Progress Notes (Signed)
Patient's pre-visit was done today over the phone with the patient due to COVID-19 pandemic. Name,DOB and address verified. Insurance verified. Patient denies any allergies to Eggs and Soy. Patient denies any problems with anesthesia/sedation. Patient denies taking diet pills or blood thinners. Packet of Prep instructions mailed to patient including a copy of a consent form-pt is aware. Patient understands to call us back with any questions or concerns. Patient is aware of our care-partner policy and [id number] safety protocol. EMMI education assigned to the patient for the procedure, sent to [facility]. The patient is COVID-19 vaccinated, per patient. suprep used for prep.

## 2020-06-18 ENCOUNTER — Encounter: Payer: 59 | Admitting: Internal Medicine

## 2020-07-14 IMAGING — CT CT HEART SCORING
2 series · 16 of 20 positions shown, 18 images · non-contrast
Comparison: None.

Addendum:
EXAM:
OVER-READ INTERPRETATION  CT CHEST

The following report is an over-read performed by radiologist Dr.
Loly Damico [REDACTED] on 02/13/2018. This
over-read does not include interpretation of cardiac or coronary
anatomy or pathology. The coronary calcium score interpretation by
the cardiologist is attached.
CLINICAL DATA: Risk stratification
Coronary Calcium Score
TECHNIQUE: The patient was scanned on a Siemens Force scanner. Axial
non-contrast 3 mm slices were carried out through the heart. The
data set was analyzed on a dedicated work station and scored using
the Agatson method.

[Series 3: casc 3.0 i36f 2 bestdiast 74 % · axial · 0.41mm/px · z∈[-183,-78]mm · 8 of 47 slices shown, 10 images]
[im 6/47  vessel]
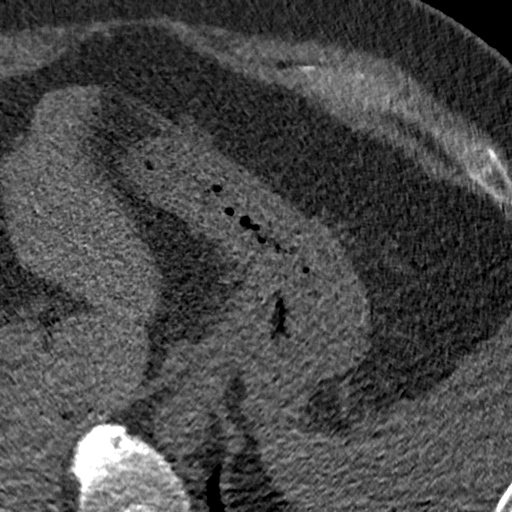
[im 6/47  lung]
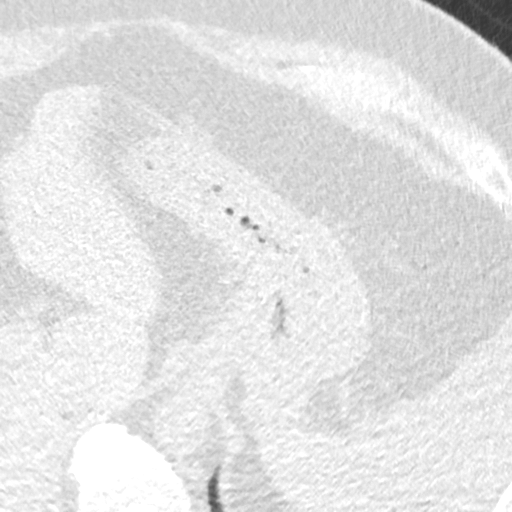
[im 11/47  vessel]
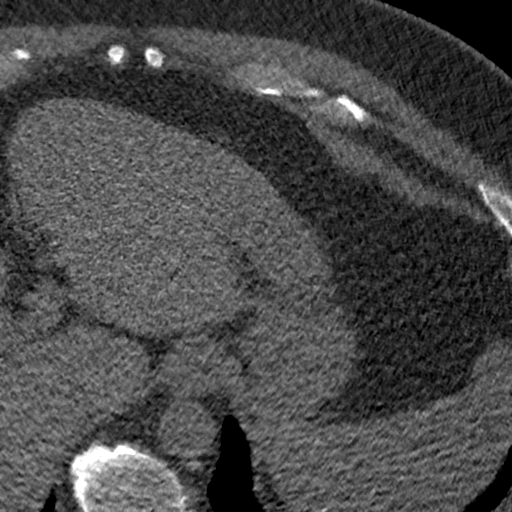
[im 16/47  vessel]
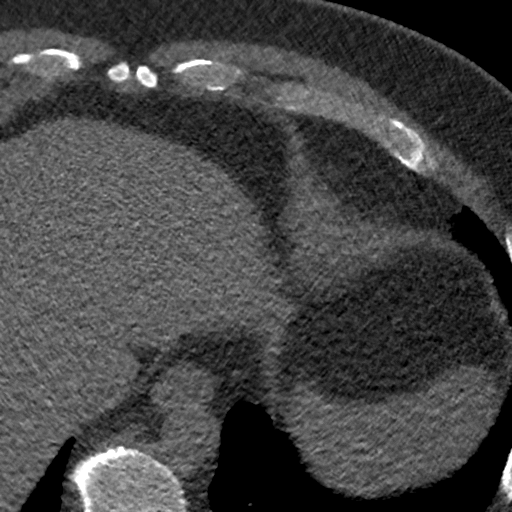
[im 21/47  vessel]
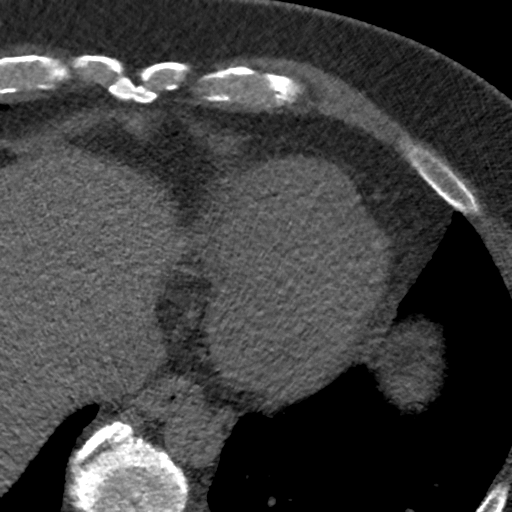
[im 26/47  vessel]
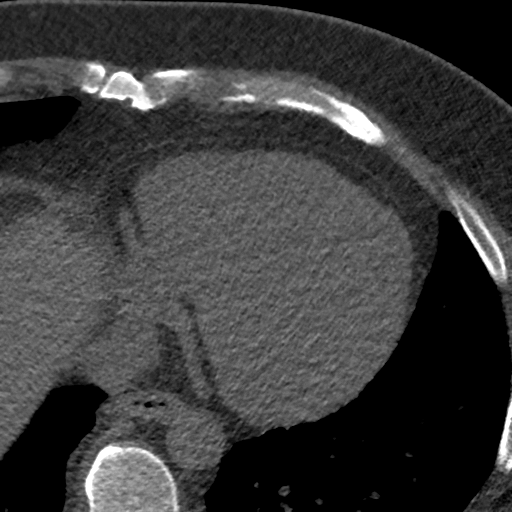
[im 26/47  lung]
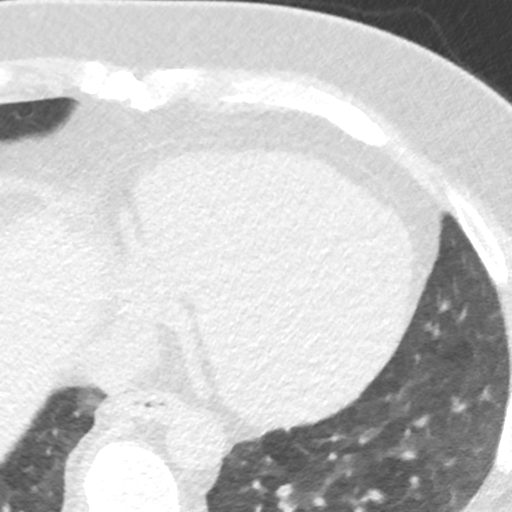
[im 31/47  vessel]
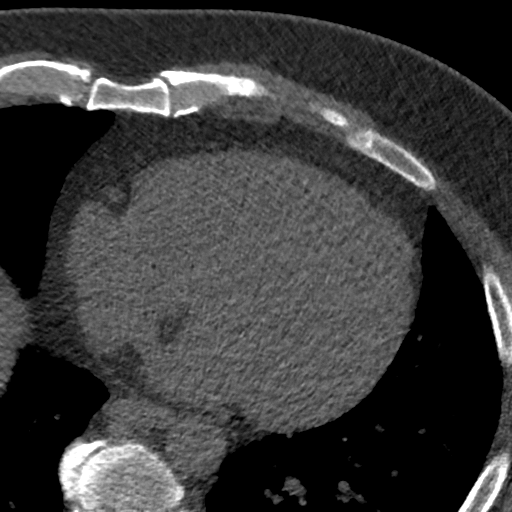
[im 36/47  vessel]
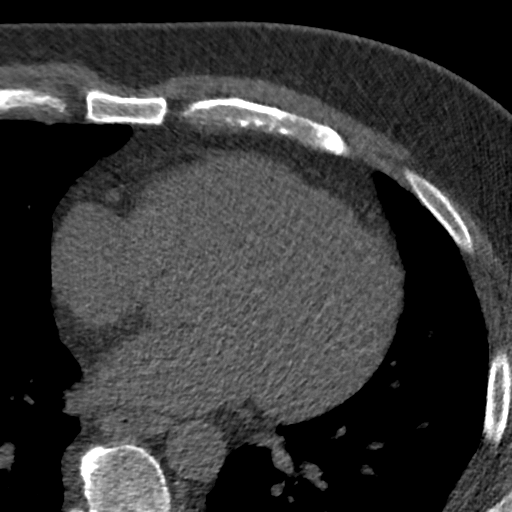
[im 41/47  vessel]
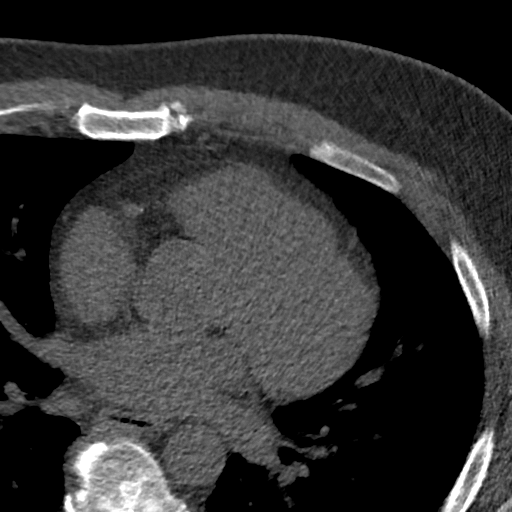

[Series 5: lung st 73 % · axial · 0.73mm/px · z∈[-182,-78]mm · 8 of 47 slices shown]
[im 6/47  lung]
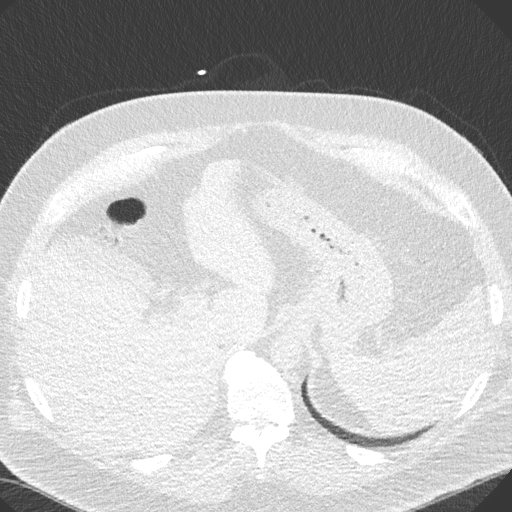
[im 11/47  lung]
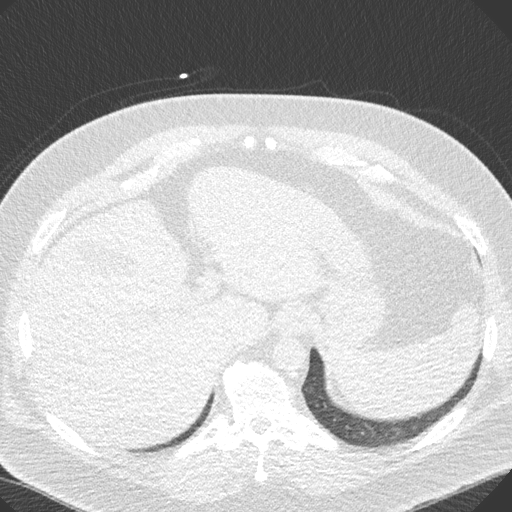
[im 16/47  lung]
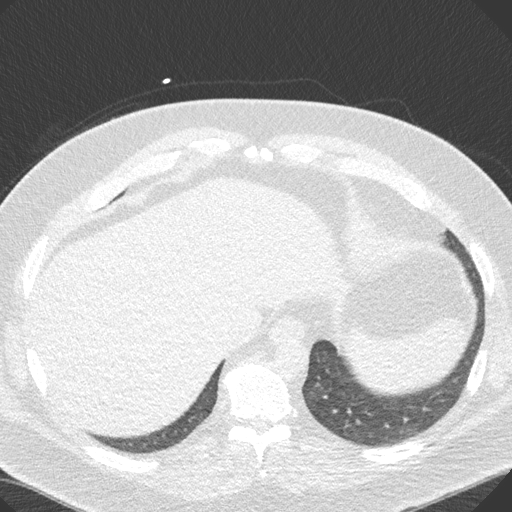
[im 21/47  lung]
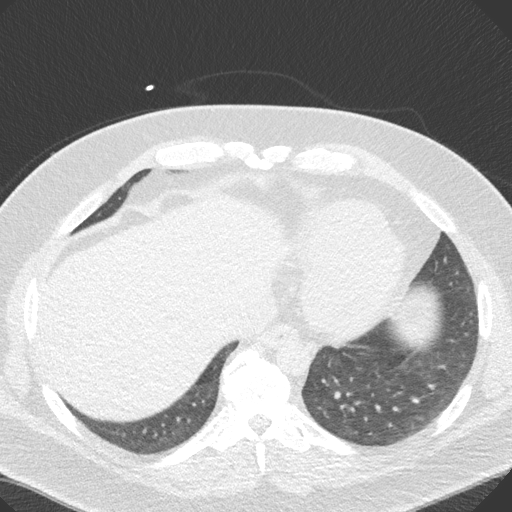
[im 26/47  lung]
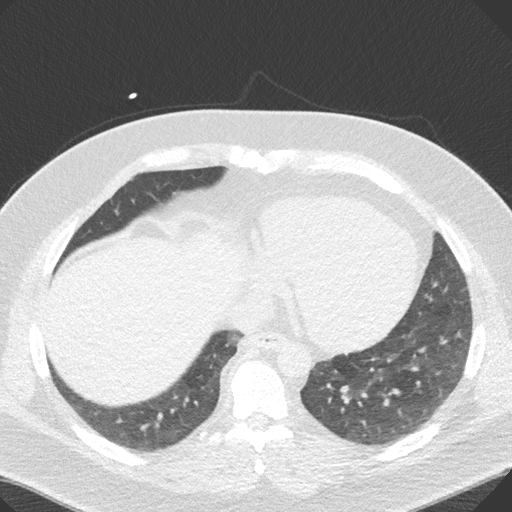
[im 31/47  lung]
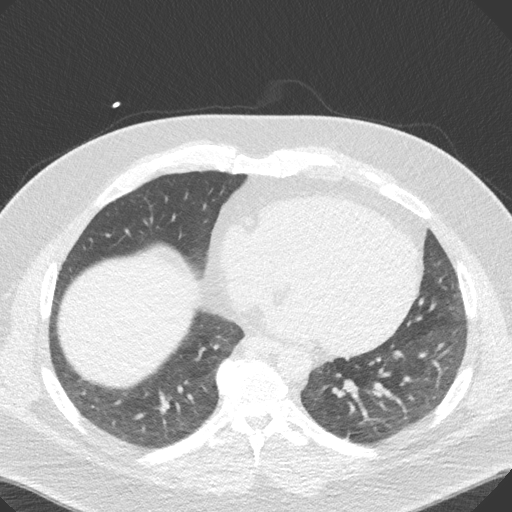
[im 36/47  lung]
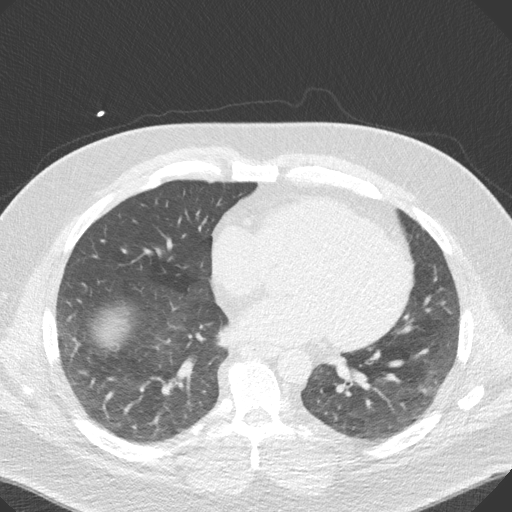
[im 41/47  lung]
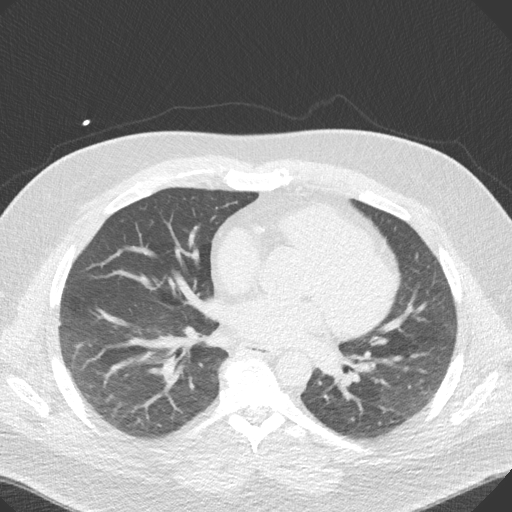

[16 of 20 positions shown; findings below may reference images not displayed]

FINDINGS: Within the visualized portions of the thorax there are no suspicious
appearing pulmonary nodules or masses, there is no acute
consolidative airspace disease, no pleural effusions, no
pneumothorax and no lymphadenopathy. Visualized portions of the
upper abdomen are unremarkable. There are no aggressive appearing
lytic or blastic lesions noted in the visualized portions of the
skeleton.
IMPRESSION: 1. No significant incidental noncardiac findings are noted.
FINDINGS: Non-cardiac: See separate report from [REDACTED].

Ascending Aorta: Not captured in field of view.

Pericardium: Normal

Coronary arteries:

Coronary calcium score of 154. This was 95th percentile for age and
sex matched control.
IMPRESSION: Coronary calcium score of 154. This was 95th percentile for age and
sex matched control. This is greater than expected for age and sex
matched peers.

*** End of Addendum ***

## 2020-08-02 HISTORY — PX: COLONOSCOPY: SHX174

## 2020-08-20 ENCOUNTER — Ambulatory Visit (AMBULATORY_SURGERY_CENTER): Payer: 59 | Admitting: Internal Medicine

## 2020-08-20 ENCOUNTER — Encounter: Payer: Self-pay | Admitting: Internal Medicine

## 2020-08-20 ENCOUNTER — Other Ambulatory Visit: Payer: Self-pay

## 2020-08-20 VITALS — BP 92/56 | HR 66 | Temp 98.0°F | Resp 12 | Ht 74.0 in | Wt 315.0 lb

## 2020-08-20 DIAGNOSIS — Z1211 Encounter for screening for malignant neoplasm of colon: Secondary | ICD-10-CM | POA: Diagnosis not present

## 2020-08-20 DIAGNOSIS — D125 Benign neoplasm of sigmoid colon: Secondary | ICD-10-CM

## 2020-08-20 DIAGNOSIS — D123 Benign neoplasm of transverse colon: Secondary | ICD-10-CM

## 2020-08-20 DIAGNOSIS — K635 Polyp of colon: Secondary | ICD-10-CM

## 2020-08-20 DIAGNOSIS — D124 Benign neoplasm of descending colon: Secondary | ICD-10-CM

## 2020-08-20 MED ORDER — SODIUM CHLORIDE 0.9 % IV SOLN: 500.0000 mL | INTRAVENOUS | Status: DC

## 2020-08-20 NOTE — Op Note (Signed)
[place] Patient Name: Robert Kaufman Procedure Date: 08/20/2020 3:39 PM MRN: [id number] Endoscopist: Gatha Mayer , MD Age: 50 Referring MD:  Date of Birth: [birth year] Gender: Male Account #: [id number] Procedure:                Colonoscopy Indications:              Screening for colorectal malignant neoplasm, This                            is the patient's first colonoscopy Medicines:                Propofol per Anesthesia, Monitored Anesthesia Care Procedure:                Pre-Anesthesia Assessment:                           - Prior to the procedure, a History and Physical                            was performed, and patient medications and                            allergies were reviewed. The patient's tolerance of                            previous anesthesia was also reviewed. The risks                            and benefits of the procedure and the sedation                            options and risks were discussed with the patient.                            All questions were answered, and informed consent                            was obtained. Prior Anticoagulants: The patient has                            taken no previous anticoagulant or antiplatelet                            agents. ASA Grade Assessment: III - A patient with                            severe systemic disease. After reviewing the risks                            and benefits, the patient was deemed in                            satisfactory condition to undergo the procedure.  After obtaining informed consent, the colonoscope                            was passed under direct vision. Throughout the                            procedure, the patient's blood pressure, pulse, and                            oxygen saturations were monitored continuously. The                            Olympus CF-HQ190L [id number]) Colonoscope was                             introduced through the anus and advanced to the the                            cecum, identified by appendiceal orifice and                            ileocecal valve. The colonoscopy was performed                            without difficulty. The patient tolerated the                            procedure well. The quality of the bowel                            preparation was adequate. The ileocecal valve,                            appendiceal orifice, and rectum were photographed.                            The bowel preparation used was Miralax via split                            dose instruction. Scope In: 3:56:02 PM Scope Out: 4:11:54 PM Scope Withdrawal Time: 0 hours 12 minutes 3 seconds  Total Procedure Duration: 0 hours 15 minutes 52 seconds  Findings:                 The perianal and digital rectal examinations were                            normal. Pertinent negatives include normal prostate                            (size, shape, and consistency).                           Three sessile polyps were found in the rectum,  sigmoid colon and transverse colon. The polyps were                            diminutive in size. These polyps were removed with                            a cold biopsy forceps. Resection and retrieval were                            complete. Verification of patient identification                            for the specimen was done. Estimated blood loss was                            minimal.                           The exam was otherwise without abnormality on                            direct and retroflexion views. Complications:            No immediate complications. Estimated Blood Loss:     Estimated blood loss was minimal. Impression:               - Three diminutive polyps in the rectum, in the                            sigmoid colon and in the transverse colon, removed                            with a cold biopsy  forceps. Resected and retrieved.                           - The examination was otherwise normal on direct                            and retroflexion views. Recommendation:           - Patient has a contact number available for                            emergencies. The signs and symptoms of potential                            delayed complications were discussed with the                            patient. Return to normal activities tomorrow.                            Written discharge instructions were provided to the  patient.                           - Resume previous diet.                           - Continue present medications.                           - Await pathology results.                           - Repeat colonoscopy is recommended. The                            colonoscopy date will be determined after pathology                            results from today's exam become available for                            review. Gatha Mayer, MD 08/20/2020 4:22:08 PM This report has been signed electronically.

## 2020-08-20 NOTE — Progress Notes (Signed)
Called to room to assist during endoscopic procedure.  Patient ID and intended procedure confirmed with present staff. Received instructions for my participation in the procedure from the performing physician.  

## 2020-08-20 NOTE — Patient Instructions (Addendum)
I found and removed 3 tiny polyps - all look benign.  I will let you know pathology results and when to have another routine colonoscopy by mail and/or My Chart.  I appreciate the opportunity to care for you. Gatha Mayer, MD, [facility]   Handout on polyps given.   YOU HAD AN ENDOSCOPIC PROCEDURE TODAY AT [facility]:   Refer to the procedure report that was given to you for any specific questions about what was found during the examination.  If the procedure report does not answer your questions, please call your gastroenterologist to clarify.  If you requested that your care partner not be given the details of your procedure findings, then the procedure report has been included in a sealed envelope for you to review at your convenience later.  YOU SHOULD EXPECT: Some feelings of bloating in the abdomen. Passage of more gas than usual.  Walking can help get rid of the air that was put into your GI tract during the procedure and reduce the bloating. If you had a lower endoscopy (such as a colonoscopy or flexible sigmoidoscopy) you may notice spotting of blood in your stool or on the toilet paper. If you underwent a bowel prep for your procedure, you may not have a normal bowel movement for a few days.  Please Note:  You might notice some irritation and congestion in your nose or some drainage.  This is from the oxygen used during your procedure.  There is no need for concern and it should clear up in a day or so.  SYMPTOMS TO REPORT IMMEDIATELY:  Following lower endoscopy (colonoscopy or flexible sigmoidoscopy):  Excessive amounts of blood in the stool  Significant tenderness or worsening of abdominal pains  Swelling of the abdomen that is new, acute  Fever of 100F or higher  For urgent or emergent issues, a gastroenterologist can be reached at any hour by calling [phone]. Do not use MyChart messaging for urgent concerns.    DIET:  We do recommend a small meal at  first, but then you may proceed to your regular diet.  Drink plenty of fluids but you should avoid alcoholic beverages for 24 hours.  ACTIVITY:  You should plan to take it easy for the rest of today and you should NOT DRIVE or use heavy machinery until tomorrow (because of the sedation medicines used during the test).    FOLLOW UP: Our staff will call the number listed on your records 48-72 hours following your procedure to check on you and address any questions or concerns that you may have regarding the information given to you following your procedure. If we do not reach you, we will leave a message.  We will attempt to reach you two times.  During this call, we will ask if you have developed any symptoms of COVID 19. If you develop any symptoms (ie: fever, flu-like symptoms, shortness of breath, cough etc.) before then, please call [phone].  If you test positive for Covid 19 in the 2 weeks post procedure, please call and report this information to [place].    If any biopsies were taken you will be contacted by phone or by letter within the next 1-3 weeks.  Please call us at [phone] if you have not heard about the biopsies in 3 weeks.    SIGNATURES/CONFIDENTIALITY: You and/or your care partner have signed paperwork which will be entered into your electronic medical record.  These signatures attest to the  fact that that the information above on your After Visit Summary has been reviewed and is understood.  Full responsibility of the confidentiality of this discharge information lies with you and/or your care-partner.

## 2020-08-20 NOTE — Progress Notes (Signed)
Pt's states no medical or surgical changes since previsit or office visit. 

## 2020-08-20 NOTE — Progress Notes (Signed)
Report given to PACU, vss 

## 2020-08-20 NOTE — Progress Notes (Signed)
[facility] History and Physical   Primary Care Physician:  Aretta Nip, MD   Reason for Procedure:   Colon cancer screening  Plan:    colonoscopy     HPI: Robert Kaufman is a 50 y.o. male here for screening colonoscopy   Past Medical History:  Diagnosis Date   Allergy    Asthma    Hyperlipidemia    Hypertension    Sleep apnea    uses CPAP    Past Surgical History:  Procedure Laterality Date   APPENDECTOMY     DECOMPRESSIVE LUMBAR LAMINECTOMY LEVEL 1 Right 02/23/2012   Procedure: MICRO-LUMBAR DECOMPRESSION  L4-L5 RIGHT    ;  Surgeon: Johnn Hai, MD;  Location: [facility];  Service: Orthopedics;  Laterality: Right;  MICRO-LUMBAR DECOMPRESSION  L4-L5 RIGHT        Prior to Admission medications   Medication Sig Start Date End Date Taking? Authorizing Provider  fluticasone (FLONASE) 50 MCG/ACT nasal spray Place 1 spray into the nose daily.   Yes [provider]  Fluticasone-Salmeterol (ADVAIR) 100-50 MCG/DOSE AEPB Inhale 1 puff into the lungs every 12 (twelve) hours.   Yes [provider]  levocetirizine (XYZAL) 5 MG tablet Take 5 mg by mouth every evening.   Yes [provider]  lisinopril-hydrochlorothiazide (PRINZIDE,ZESTORETIC) 20-25 MG per tablet Take 1 tablet by mouth daily before breakfast.   Yes [provider]  montelukast (SINGULAIR) 10 MG tablet Take 10 mg by mouth daily before breakfast.   Yes [provider]  rosuvastatin (CRESTOR) 10 MG tablet Take 10 mg by mouth daily before breakfast.   Yes [provider]  Na Sulfate-K Sulfate-Mg Sulf 17.5-3.13-1.6 GM/177ML SOLN Suprep (no substitutions)-TAKE AS DIRECTED. 06/04/20   Gatha Mayer, MD  naproxen (NAPROSYN) 500 MG tablet Take 1 tablet (500 mg total) by mouth 2 (two) times daily. 08/01/18   Petrucelli, Glynda Jaeger, PA-C    Current Outpatient Medications  Medication Sig Dispense Refill   fluticasone (FLONASE) 50 MCG/ACT nasal spray Place 1  spray into the nose daily.     Fluticasone-Salmeterol (ADVAIR) 100-50 MCG/DOSE AEPB Inhale 1 puff into the lungs every 12 (twelve) hours.     levocetirizine (XYZAL) 5 MG tablet Take 5 mg by mouth every evening.     lisinopril-hydrochlorothiazide (PRINZIDE,ZESTORETIC) 20-25 MG per tablet Take 1 tablet by mouth daily before breakfast.     montelukast (SINGULAIR) 10 MG tablet Take 10 mg by mouth daily before breakfast.     rosuvastatin (CRESTOR) 10 MG tablet Take 10 mg by mouth daily before breakfast.     Na Sulfate-K Sulfate-Mg Sulf 17.5-3.13-1.6 GM/177ML SOLN Suprep (no substitutions)-TAKE AS DIRECTED. 354 mL 0   naproxen (NAPROSYN) 500 MG tablet Take 1 tablet (500 mg total) by mouth 2 (two) times daily. 10 tablet 0   Current Facility-Administered Medications  Medication Dose Route Frequency Provider Last Rate Last Admin   0.9 %  sodium chloride infusion  500 mL Intravenous Continuous Gatha Mayer, MD        Allergies as of 08/20/2020   (No Known Allergies)    Family History  Problem Relation Age of Onset   Heart attack Father 11   Lung disease Father 90   Stroke Sister 78   Diabetes Sister 61   Colon polyps Sister    Diabetes Mother 20   Dementia Maternal Grandmother 4   Alzheimer's disease Maternal Grandmother    Heart attack Maternal Grandfather 50   Dementia Paternal Grandmother 88  Heart defect Paternal Grandfather 55   Colon cancer Neg Hx    Esophageal cancer Neg Hx    Rectal cancer Neg Hx    Stomach cancer Neg Hx     Social History   Socioeconomic History   Marital status: Married    Spouse name: Not on file   Number of children: Not on file   Years of education: Not on file   Highest education level: Not on file  Occupational History   Not on file  Tobacco Use   Smoking status: Never   Smokeless tobacco: Never  Vaping Use   Vaping Use: Never used  Substance and Sexual Activity   Alcohol use: Yes    Alcohol/week: 3.0 standard drinks    Types: 3  Standard drinks or equivalent per week   Drug use: No   Sexual activity: Not on file  Other Topics Concern   Not on file  Social History Narrative   Not on file   Social Determinants of Health   Financial Resource Strain: Not on file  Food Insecurity: Not on file  Transportation Needs: Not on file  Physical Activity: Not on file  Stress: Not on file  Social Connections: Not on file  Intimate Partner Violence: Not on file    Review of Systems: All other review of systems negative except as mentioned in the HPI.  Physical Exam: Vital signs BP 124/74   Pulse 84   Temp 98 F (36.7 C) (Temporal)   Ht '6\' 2"'$  (1.88 m)   Wt (!) 315 lb (142.9 kg)   SpO2 98%   BMI 40.44 kg/m   General:   Alert,  Well-developed, well-nourished, pleasant and cooperative in NAD Lungs:  Clear throughout to auscultation.   Heart:  Regular rate and rhythm; no murmurs, clicks, rubs,  or gallops. Abdomen:  Soft, nontender and nondistended. Normal bowel sounds.   Neuro/Psych:  Alert and cooperative. Normal mood and affect. A and O x 3   '@Asiah Befort'$  Simonne Maffucci, MD, [facility] [phone] (pager) 08/20/2020 3:43 PM@

## 2020-08-24 ENCOUNTER — Telehealth: Payer: Self-pay

## 2020-08-24 NOTE — Telephone Encounter (Signed)
  Follow up Call-  Call back number 08/20/2020  Post procedure Call Back phone  # [phone]  Some recent data might be hidden     Patient questions:  Do you have a fever, pain , or abdominal swelling? No. Pain Score  0 *  Have you tolerated food without any problems? Yes.    Have you been able to return to your normal activities? Yes.    Do you have any questions about your discharge instructions: Diet   No. Medications  No. Follow up visit  No.  Do you have questions or concerns about your Care? No.  Actions: * If pain score is 4 or above: No action needed, pain <4.  Have you developed a fever since your procedure? no  2.   Have you had an respiratory symptoms (SOB or cough) since your procedure? no  3.   Have you tested positive for COVID 19 since your procedure no  4.   Have you had any family members/close contacts diagnosed with the COVID 19 since your procedure?  no   If yes to any of these questions please route to Joylene John, RN and Joella Prince, RN

## 2020-09-01 ENCOUNTER — Encounter: Payer: Self-pay | Admitting: Internal Medicine

## 2020-12-10 ENCOUNTER — Other Ambulatory Visit (HOSPITAL_BASED_OUTPATIENT_CLINIC_OR_DEPARTMENT_OTHER): Payer: Self-pay

## 2020-12-10 ENCOUNTER — Ambulatory Visit: Payer: 59 | Attending: Internal Medicine

## 2020-12-10 ENCOUNTER — Other Ambulatory Visit: Payer: Self-pay

## 2020-12-10 DIAGNOSIS — Z23 Encounter for immunization: Secondary | ICD-10-CM

## 2020-12-10 MED ORDER — PFIZER COVID-19 VAC BIVALENT 30 MCG/0.3ML IM SUSP
INTRAMUSCULAR | 0 refills | Status: DC
  Filled 2020-12-10: qty 0.3, 1d supply, fill #0

## 2020-12-10 NOTE — Progress Notes (Signed)
   Covid-19 Vaccination Clinic  Name:  Robert Kaufman    MRN: [id number] DOB: 09-12-1970  12/10/2020  Mr. Robert Kaufman was observed post Covid-19 immunization for 15 minutes without incident. He was provided with Vaccine Information Sheet and instruction to access the V-Safe system.   Robert Kaufman was instructed to call 911 with any severe reactions post vaccine: Difficulty breathing  Swelling of face and throat  A fast heartbeat  A bad rash all over body  Dizziness and weakness   Immunizations Administered     Name Date Dose VIS Date Route   Pfizer Covid-19 Vaccine Bivalent Booster 12/10/2020  1:20 PM 0.3 mL 09/01/2020 Intramuscular   Manufacturer: [place]   Lot: [id number]   [place]: [phone]

## 2020-12-30 IMAGING — CR CHEST - 2 VIEW
3 series · 3 of 3 positions shown · non-contrast
Comparison: 02/22/2012

CLINICAL DATA: Right rib pain

EXAM:
CHEST - 2 VIEW

[chest lat]
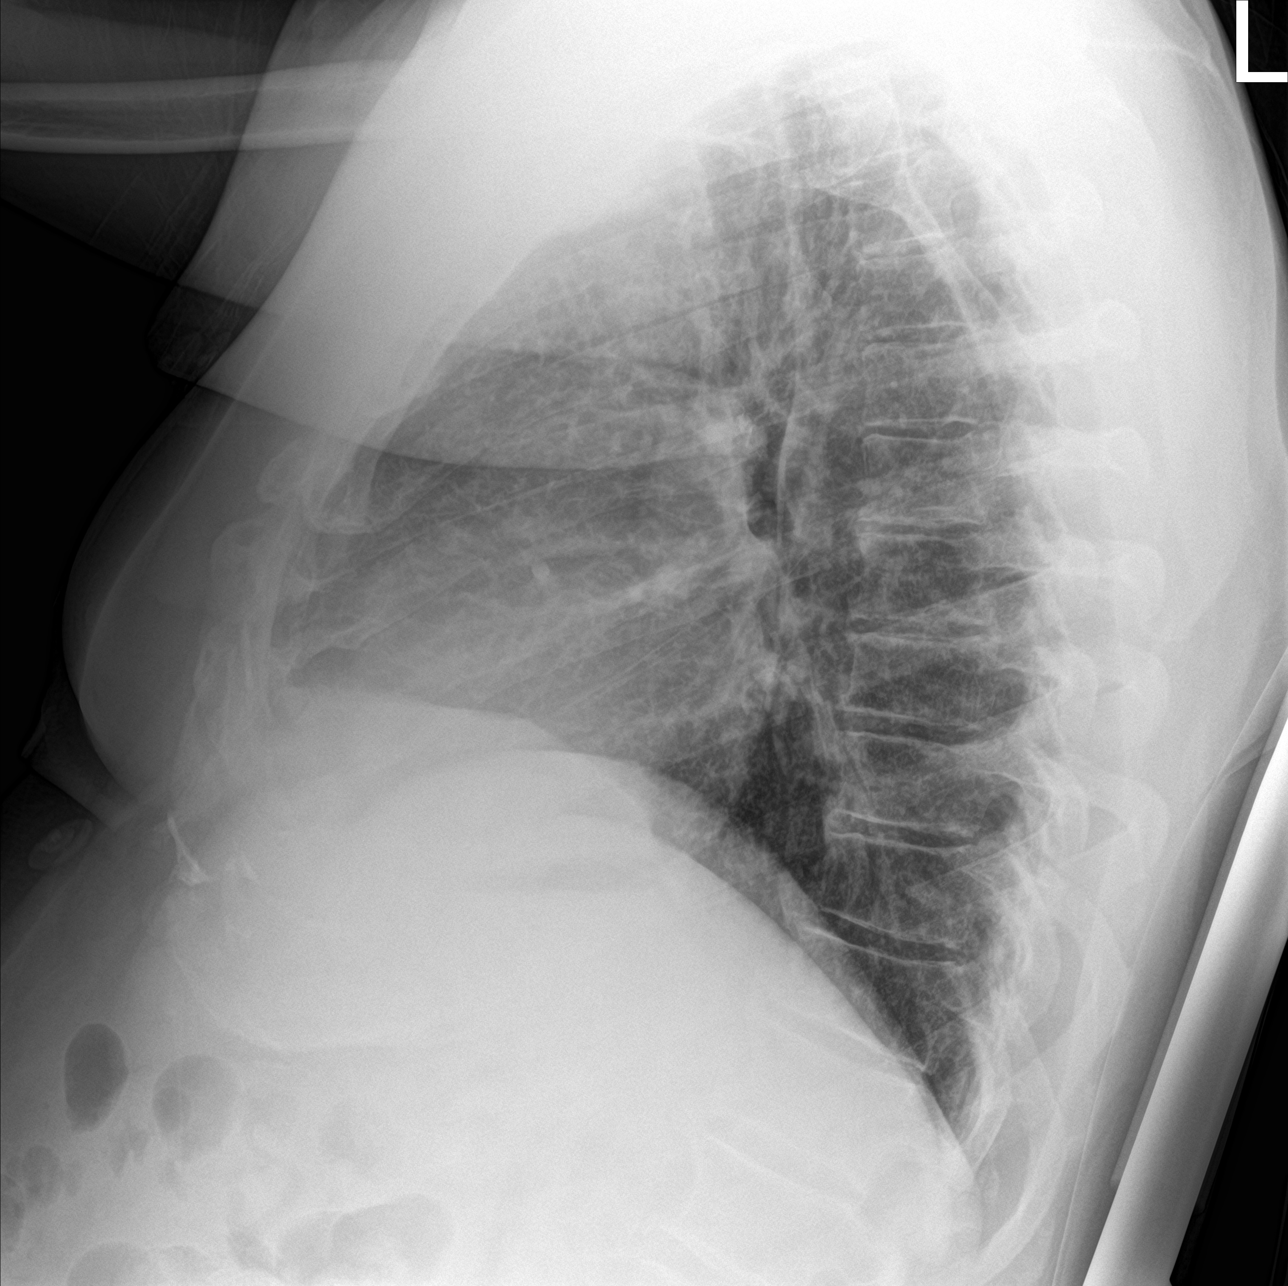

[chest ap (1 of 2)]
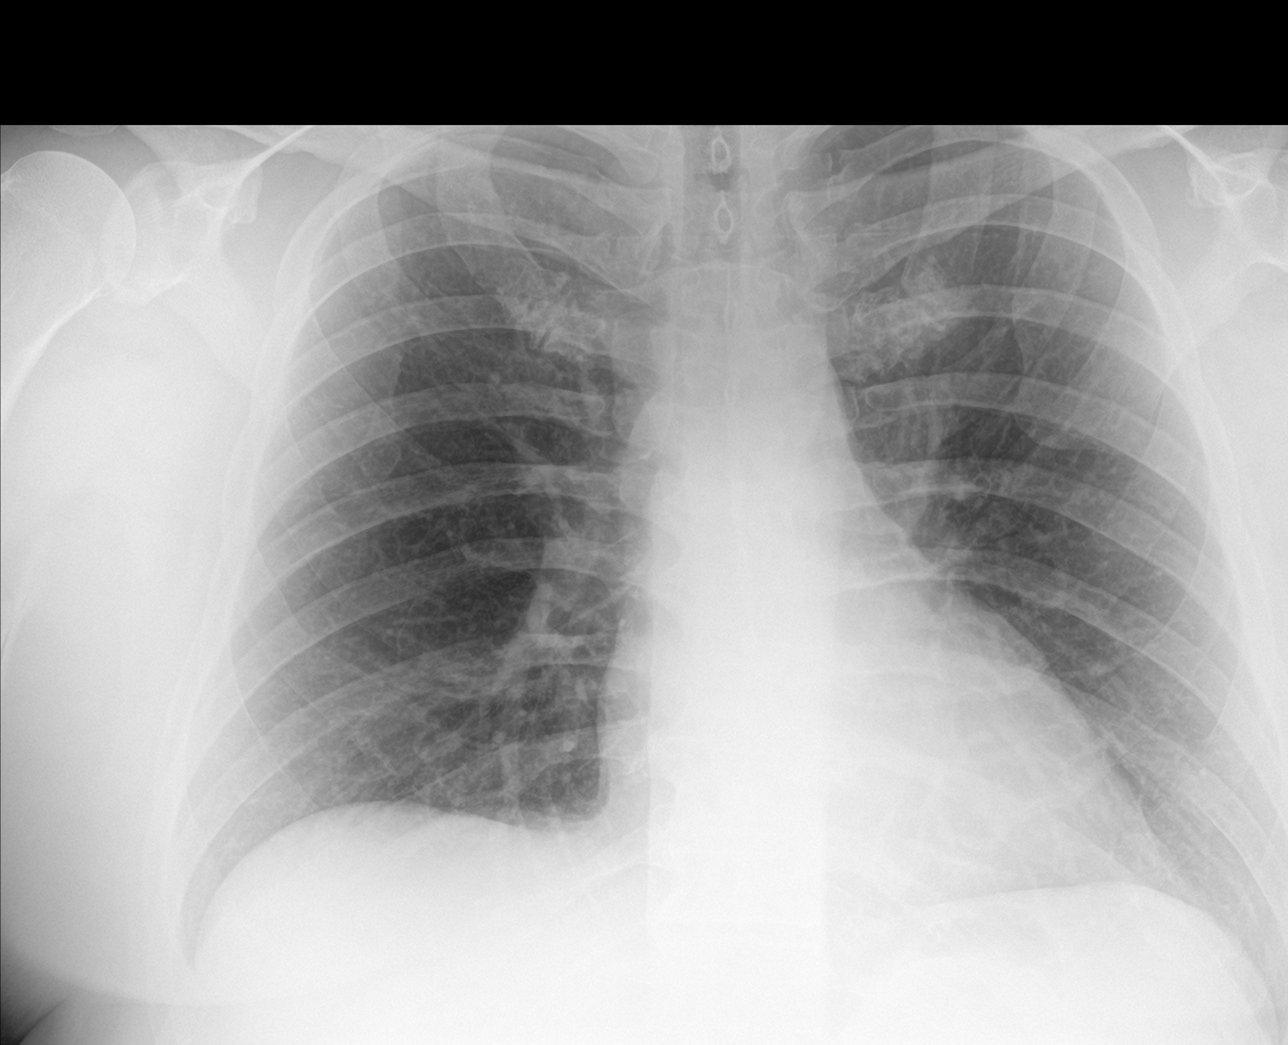

[chest ap (2 of 2)]
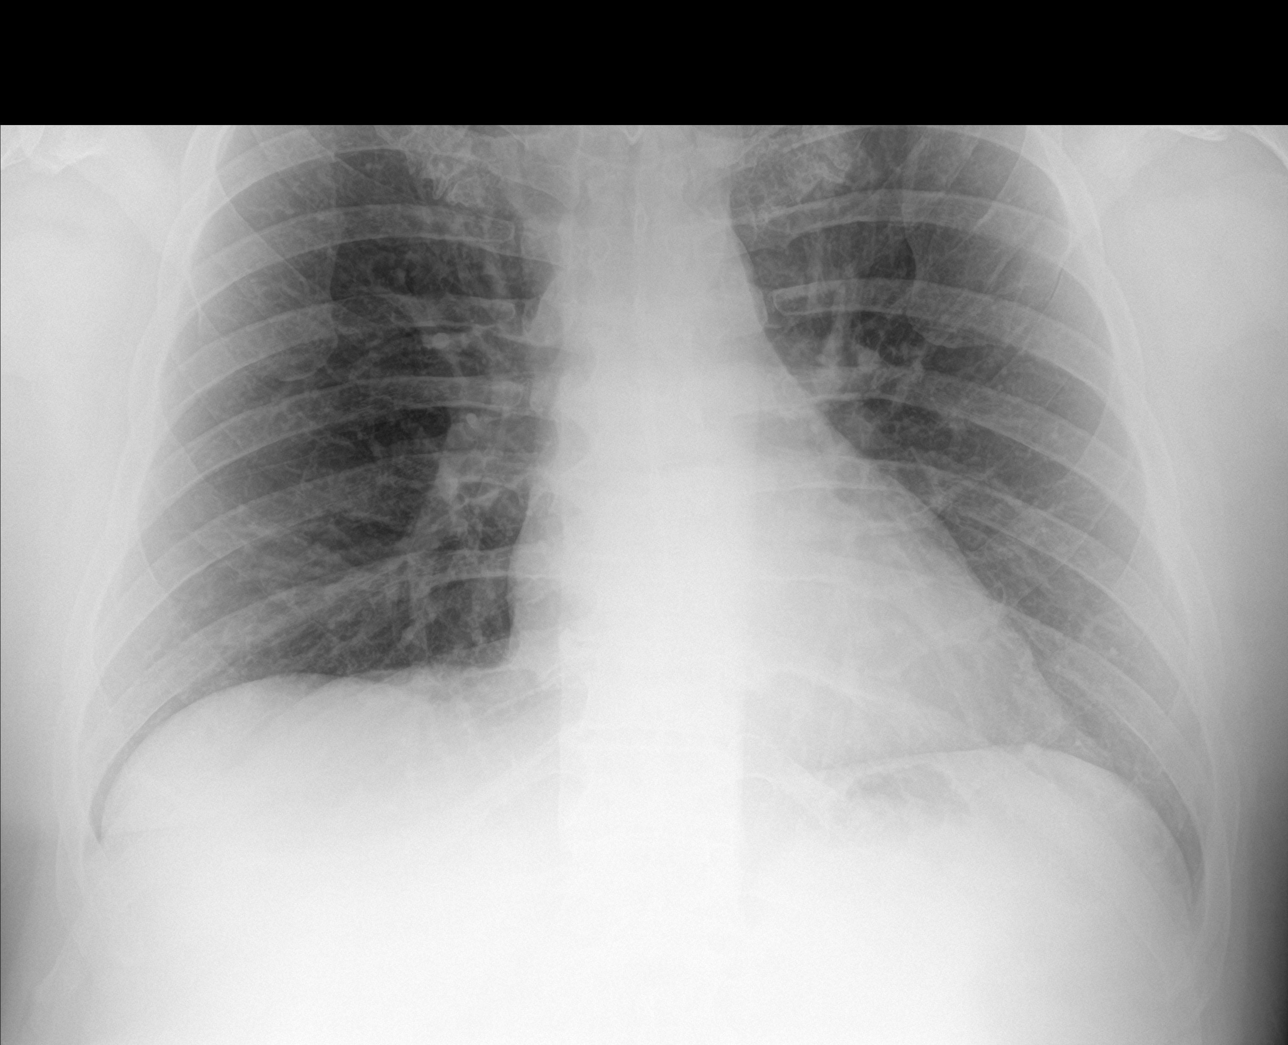

[3 of 3 positions shown; findings below may reference images not displayed]

FINDINGS: The heart size and mediastinal contours are within normal limits.
Both lungs are clear. The visualized skeletal structures are
unremarkable.
IMPRESSION: Negative chest.

## 2021-06-23 ENCOUNTER — Ambulatory Visit: Payer: Self-pay | Admitting: Orthopedic Surgery

## 2021-06-23 DIAGNOSIS — M5126 Other intervertebral disc displacement, lumbar region: Secondary | ICD-10-CM

## 2021-06-23 NOTE — H&P (Signed)
Robert Kaufman is an 51 y.o. male.   Chief Complaint: back and left leg pain HPI: Reason for Visit: (normal) visit for: (back) Location (Lower Extremity): lower back pain on the left; leg pain on the left, , Severity: pain level 7/10 Aggravating Factors: standing for ; leaning back Associated Symptoms: numbness/tingling; weakness (LLE) Medications: Gabapentin '300mg'$  tid Notes: 06/04/21 L L4 and L5 SNRB 20% relief Patient reports better. Is able to stand upright recently 2 to 3 hours. No change in his weakness or numbness  Past Medical History:  Diagnosis Date   Allergy    Asthma    Hyperlipidemia    Hypertension    Sleep apnea    uses CPAP    Past Surgical History:  Procedure Laterality Date   APPENDECTOMY     COLONOSCOPY  08/2020   3 diminutive hyperplastic polyps   DECOMPRESSIVE LUMBAR LAMINECTOMY LEVEL 1 Right 02/23/2012   Procedure: MICRO-LUMBAR DECOMPRESSION  L4-L5 RIGHT    ;  Surgeon: Johnn Hai, MD;  Location: [facility];  Service: Orthopedics;  Laterality: Right;  MICRO-LUMBAR DECOMPRESSION  L4-L5 RIGHT        Family History  Problem Relation Age of Onset   Heart attack Father 81   Lung disease Father 9   Stroke Sister 41   Diabetes Sister 38   Colon polyps Sister    Diabetes Mother 38   Dementia Maternal Grandmother 54   Alzheimer's disease Maternal Grandmother    Heart attack Maternal Grandfather 56   Dementia Paternal Grandmother 88   Heart defect Paternal Grandfather 31   Colon cancer Neg Hx    Esophageal cancer Neg Hx    Rectal cancer Neg Hx    Stomach cancer Neg Hx    Social History:  reports that he has never smoked. He has never used smokeless tobacco. He reports current alcohol use of about 3.0 standard drinks of alcohol per week. He reports that he does not use drugs.  Allergies: No Known Allergies  Current meds: Advair Diskus 100 mcg-50 mcg/dose powder for inhalation fluticasone propionate 50 mcg/actuation nasal spray,suspension gabapentin  300 mg capsule levocetirizine 5 mg tablet lisinopriL 20 mg-hydrochlorothiazide 25 mg tablet methocarbamoL 500 mg tablet montelukast 10 mg tablet Percocet 5 mg-325 mg tablet predniSONE 10 mg tablets in a dose pack rosuvastatin 10 mg tablet  Review of Systems  Constitutional: Negative.   HENT: Negative.    Eyes: Negative.   Respiratory: Negative.    Cardiovascular: Negative.   Gastrointestinal: Negative.   Endocrine: Negative.   Genitourinary: Negative.   Musculoskeletal:  Positive for back pain and gait problem.  Skin: Negative.   Neurological:  Positive for weakness and numbness.  Psychiatric/Behavioral: Negative.      There were no vitals taken for this visit. Physical Exam Constitutional:      Appearance: Normal appearance.  HENT:     Head: Normocephalic and atraumatic.     Right Ear: External ear normal.     Left Ear: External ear normal.     Nose: Nose normal.     Mouth/Throat:     Pharynx: Oropharynx is clear.  Eyes:     Conjunctiva/sclera: Conjunctivae normal.  Cardiovascular:     Rate and Rhythm: Normal rate and regular rhythm.     Pulses: Normal pulses.     Heart sounds: Normal heart sounds.  Pulmonary:     Effort: Pulmonary effort is normal.     Breath sounds: Normal breath sounds.  Abdominal:     General:  Bowel sounds are normal.  Musculoskeletal:     Cervical back: Normal range of motion and neck supple.     Comments: Straight leg raise produces buttock and thigh pain. He is slight dorsiflexion weakness on the left compared to the right. Slightly altered sensation in the L5 dermatome  Skin:    General: Skin is warm and dry.  Neurological:     Mental Status: He is alert.      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 We mutually agreed to have him observe this week whether he gets benefit further from his injection to the point where to increase his activity. I have indicated that we are looking for increased strength as well as reduction of his pain and diminishing of his numbness. If this does not occur we discussed lumbar decompression. He will call on Friday we will discuss proceeding with surgery if indicated 2 to 3-week leg time  I had an extensive discussion with the patient concerning the pathology relevant anatomy and treatment options. At this point exhausting conservative treatment and in the presence of a neurologic deficit we discussed microlumbar decompression. I discussed the risks and benefits including bleeding, infection, DVT, PE, anesthetic complications, worsening in their symptoms, improvement in their symptoms, C SF leakage, epidural fibrosis, need for future surgeries such as revision discectomy and lumbar fusion. I also indicated that this is an operation to basically decompress the nerve roots to allow recovery as opposed to fixing a herniated disc if it is encountered and that the incidence of recurrent chest disc herniation can approach 15%. Also that nerve root recovery is variable and may not recover completely. Any ligament or bone that is contributing to compressing the nerves will be removed as well.  I discussed the operative course including overnight in the hospital. Immediate ambulation. Follow-up in 2 weeks for suture removal. 6 weeks until healing of the herniation and surgical incision followed by 6 weeks of reconditioning and strengthening of the core musculature. Also discussed the need to employ the concepts of disc pressure management and core motion following the surgery to minimize the risk of recurrent disc herniation. We will obtain preoperative clearance i if necessary and proceed accordingly.  Continue with gabapentin that was prescribed previously. And as  needed oxycodone.  He is to get up from laying down and walk every 1/2 hour.  We have indicated the increased difficulty with revision surgery. And that a central decompression versus unilateral would be utilized.  Plan revision microlumbar laminectomy and discectomy L4-5  Cecilie Kicks, PA-C for Dr Tonita Cong 06/23/2021, 1:10 PM

## 2021-06-23 NOTE — H&P (View-Only) (Signed)
Robert Kaufman is an 51 y.o. male.   Chief Complaint: back and left leg pain HPI: Reason for Visit: (normal) visit for: (back) Location (Lower Extremity): lower back pain on the left; leg pain on the left, , Severity: pain level 7/10 Aggravating Factors: standing for ; leaning back Associated Symptoms: numbness/tingling; weakness (LLE) Medications: Gabapentin '300mg'$  tid Notes: 06/04/21 L L4 and L5 SNRB 20% relief Patient reports better. Is able to stand upright recently 2 to 3 hours. No change in his weakness or numbness  Past Medical History:  Diagnosis Date   Allergy    Asthma    Hyperlipidemia    Hypertension    Sleep apnea    uses CPAP    Past Surgical History:  Procedure Laterality Date   APPENDECTOMY     COLONOSCOPY  08/2020   3 diminutive hyperplastic polyps   DECOMPRESSIVE LUMBAR LAMINECTOMY LEVEL 1 Right 02/23/2012   Procedure: MICRO-LUMBAR DECOMPRESSION  L4-L5 RIGHT    ;  Surgeon: Johnn Hai, MD;  Location: [facility];  Service: Orthopedics;  Laterality: Right;  MICRO-LUMBAR DECOMPRESSION  L4-L5 RIGHT        Family History  Problem Relation Age of Onset   Heart attack Father 30   Lung disease Father 66   Stroke Sister 31   Diabetes Sister 47   Colon polyps Sister    Diabetes Mother 25   Dementia Maternal Grandmother 18   Alzheimer's disease Maternal Grandmother    Heart attack Maternal Grandfather 44   Dementia Paternal Grandmother 88   Heart defect Paternal Grandfather 43   Colon cancer Neg Hx    Esophageal cancer Neg Hx    Rectal cancer Neg Hx    Stomach cancer Neg Hx    Social History:  reports that he has never smoked. He has never used smokeless tobacco. He reports current alcohol use of about 3.0 standard drinks of alcohol per week. He reports that he does not use drugs.  Allergies: No Known Allergies  Current meds: Advair Diskus 100 mcg-50 mcg/dose powder for inhalation fluticasone propionate 50 mcg/actuation nasal spray,suspension gabapentin  300 mg capsule levocetirizine 5 mg tablet lisinopriL 20 mg-hydrochlorothiazide 25 mg tablet methocarbamoL 500 mg tablet montelukast 10 mg tablet Percocet 5 mg-325 mg tablet predniSONE 10 mg tablets in a dose pack rosuvastatin 10 mg tablet  Review of Systems  Constitutional: Negative.   HENT: Negative.    Eyes: Negative.   Respiratory: Negative.    Cardiovascular: Negative.   Gastrointestinal: Negative.   Endocrine: Negative.   Genitourinary: Negative.   Musculoskeletal:  Positive for back pain and gait problem.  Skin: Negative.   Neurological:  Positive for weakness and numbness.  Psychiatric/Behavioral: Negative.      There were no vitals taken for this visit. Physical Exam Constitutional:      Appearance: Normal appearance.  HENT:     Head: Normocephalic and atraumatic.     Right Ear: External ear normal.     Left Ear: External ear normal.     Nose: Nose normal.     Mouth/Throat:     Pharynx: Oropharynx is clear.  Eyes:     Conjunctiva/sclera: Conjunctivae normal.  Cardiovascular:     Rate and Rhythm: Normal rate and regular rhythm.     Pulses: Normal pulses.     Heart sounds: Normal heart sounds.  Pulmonary:     Effort: Pulmonary effort is normal.     Breath sounds: Normal breath sounds.  Abdominal:     General:  Bowel sounds are normal.  Musculoskeletal:     Cervical back: Normal range of motion and neck supple.     Comments: Straight leg raise produces buttock and thigh pain. He is slight dorsiflexion weakness on the left compared to the right. Slightly altered sensation in the L5 dermatome  Skin:    General: Skin is warm and dry.  Neurological:     Mental Status: He is alert.      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 We mutually agreed to have him observe this week whether he gets benefit further from his injection to the point where to increase his activity. I have indicated that we are looking for increased strength as well as reduction of his pain and diminishing of his numbness. If this does not occur we discussed lumbar decompression. He will call on Friday we will discuss proceeding with surgery if indicated 2 to 3-week leg time  I had an extensive discussion with the patient concerning the pathology relevant anatomy and treatment options. At this point exhausting conservative treatment and in the presence of a neurologic deficit we discussed microlumbar decompression. I discussed the risks and benefits including bleeding, infection, DVT, PE, anesthetic complications, worsening in their symptoms, improvement in their symptoms, C SF leakage, epidural fibrosis, need for future surgeries such as revision discectomy and lumbar fusion. I also indicated that this is an operation to basically decompress the nerve roots to allow recovery as opposed to fixing a herniated disc if it is encountered and that the incidence of recurrent chest disc herniation can approach 15%. Also that nerve root recovery is variable and may not recover completely. Any ligament or bone that is contributing to compressing the nerves will be removed as well.  I discussed the operative course including overnight in the hospital. Immediate ambulation. Follow-up in 2 weeks for suture removal. 6 weeks until healing of the herniation and surgical incision followed by 6 weeks of reconditioning and strengthening of the core musculature. Also discussed the need to employ the concepts of disc pressure management and core motion following the surgery to minimize the risk of recurrent disc herniation. We will obtain preoperative clearance i if necessary and proceed accordingly.  Continue with gabapentin that was prescribed previously. And as  needed oxycodone.  He is to get up from laying down and walk every 1/2 hour.  We have indicated the increased difficulty with revision surgery. And that a central decompression versus unilateral would be utilized.  Plan revision microlumbar laminectomy and discectomy L4-5  Cecilie Kicks, PA-C for Dr Tonita Cong 06/23/2021, 1:10 PM

## 2021-07-13 ENCOUNTER — Other Ambulatory Visit: Payer: Self-pay

## 2021-07-13 ENCOUNTER — Encounter (HOSPITAL_COMMUNITY): Payer: Self-pay | Admitting: Specialist

## 2021-07-13 NOTE — Pre-Procedure Instructions (Signed)
[facility] [place], [place] [address] [place] [address] Phone: [phone] Fax: [phone]   PCP - [facility] [place]  CPAP - Wears every night. Set to automatic  ERAS Protcol - NPO  Anesthesia review: N  Patient verbally denies any shortness of breath, fever, cough and chest pain during phone call   -------------  SDW INSTRUCTIONS given:  Your procedure is scheduled on 07/14/21.  Report to [facility] Main Entrance "A" at [place] and check in at the Admitting office.  Call this number if you have problems the morning of surgery:  [phone]   Remember:  Do not eat or drink after midnight the night before your surgery     Take these medicines the morning of surgery with A SIP OF WATER  cetirizine (ZYRTEC) fluticasone (FLONASE)  Fluticasone-Salmeterol (ADVAIR) gabapentin (NEURONTIN) montelukast (SINGULAIR) rosuvastatin (CRESTOR)  As of today, STOP taking any Aspirin (unless otherwise instructed by your surgeon) Aleve, Naproxen, Ibuprofen, Motrin, Advil, Goody's, BC's, all herbal medications, fish oil, and all vitamins.                      Do not wear jewelry, make up, or nail polish            Do not wear lotions, powders, perfumes/colognes, or deodorant.            Do not shave 48 hours prior to surgery.  Men may shave face and neck.            Do not bring valuables to the hospital.            [facility] is not responsible for any belongings or valuables.  Do NOT Smoke (Tobacco/Vaping) 24 hours prior to your procedure If you use a CPAP at night, you may bring all equipment for your overnight stay.   Contacts, glasses, dentures or bridgework may not be worn into surgery.      For patients admitted to the hospital, discharge time will be determined by your treatment team.   Patients discharged the day of surgery will not be allowed to  drive home, and someone needs to stay with them for 24 hours.    Special instructions:   [facility]- Preparing For Surgery  Before surgery, you can play an important role. Because skin is not sterile, your skin needs to be as free of germs as possible. You can reduce the number of germs on your skin by washing with CHG (chlorahexidine gluconate) Soap before surgery.  CHG is an antiseptic cleaner which kills germs and bonds with the skin to continue killing germs even after washing.    Oral Hygiene is also important to reduce your risk of infection.  Remember - BRUSH YOUR TEETH THE MORNING OF SURGERY WITH YOUR REGULAR TOOTHPASTE  Please do not use if you have an allergy to CHG or antibacterial soaps. If your skin becomes reddened/irritated stop using the CHG.  Do not shave (including legs and underarms) for at least 48 hours prior to first CHG shower. It is OK to shave your face.  Please follow these instructions carefully.   Shower the NIGHT BEFORE SURGERY and the MORNING OF SURGERY with DIAL Soap.   Pat yourself dry with a CLEAN TOWEL.  Wear CLEAN PAJAMAS to bed the night before surgery  Place CLEAN SHEETS on  your bed the night of your first shower and DO NOT SLEEP WITH PETS.   Day of Surgery: Please shower morning of surgery  Wear Clean/Comfortable clothing the morning of surgery Do not apply any deodorants/lotions.   Remember to brush your teeth WITH YOUR REGULAR TOOTHPASTE.   Questions were answered. Patient verbalized understanding of instructions.

## 2021-07-13 NOTE — Anesthesia Preprocedure Evaluation (Signed)
Anesthesia Evaluation  Patient identified by MRN, date of birth, ID band Patient awake    Reviewed: Allergy & Precautions, NPO status , Patient's Chart, lab work & pertinent test results  Airway Mallampati: II  TM Distance: >3 FB Neck ROM: Full    Dental no notable dental hx. (+) Teeth Intact, Dental Advisory Given   Pulmonary asthma , sleep apnea and Continuous Positive Airway Pressure Ventilation ,    Pulmonary exam normal breath sounds clear to auscultation       Cardiovascular hypertension, Pt. on medications Normal cardiovascular exam Rhythm:Regular Rate:Normal     Neuro/Psych  Neuromuscular disease (LL leg/ back pain) negative psych ROS   GI/Hepatic negative GI ROS, Neg liver ROS,   Endo/Other  Morbid obesity (BMI 40.4)  Renal/GU      Musculoskeletal negative musculoskeletal ROS (+)   Abdominal   Peds  Hematology Lab Results      Component                Value               Date                      WBC                      12.2 (H)            08/01/2018                HGB                      15.1                08/01/2018                HCT                      45.5                08/01/2018                MCV                      86.0                08/01/2018                PLT                      205                 08/01/2018              Anesthesia Other Findings   Reproductive/Obstetrics                            Anesthesia Physical Anesthesia Plan  ASA: 3  Anesthesia Plan: General   Post-op Pain Management: Ketamine IV*, Tylenol PO (pre-op)* and Dilaudid IV   Induction: Intravenous  PONV Risk Score and Plan: Midazolam, Dexamethasone, Ondansetron and Treatment may vary due to age or medical condition  Airway Management Planned: Oral ETT  Additional Equipment: None  Intra-op Plan:   Post-operative Plan: Extubation in OR  Informed Consent: I have reviewed the  patients History and Physical, chart, labs and discussed the procedure including the risks, benefits and  alternatives for the proposed anesthesia with the patient or authorized representative who has indicated his/her understanding and acceptance.     Dental advisory given  Plan Discussed with:   Anesthesia Plan Comments:        Anesthesia Quick Evaluation

## 2021-07-14 ENCOUNTER — Ambulatory Visit (HOSPITAL_BASED_OUTPATIENT_CLINIC_OR_DEPARTMENT_OTHER): Payer: 59 | Admitting: Anesthesiology

## 2021-07-14 ENCOUNTER — Ambulatory Visit (HOSPITAL_COMMUNITY): Payer: 59 | Admitting: Anesthesiology

## 2021-07-14 ENCOUNTER — Ambulatory Visit (HOSPITAL_COMMUNITY): Payer: 59

## 2021-07-14 ENCOUNTER — Ambulatory Visit (HOSPITAL_COMMUNITY)
Admission: RE | Admit: 2021-07-14 | Discharge: 2021-07-14 | Disposition: A | Discharge status: Home / Self Care | Payer: 59 | Attending: Specialist | Admitting: Specialist

## 2021-07-14 ENCOUNTER — Encounter (HOSPITAL_COMMUNITY)
Admission: RE | Disposition: A | Discharge status: Home / Self Care | Payer: Self-pay | Source: Home / Self Care | Attending: Specialist

## 2021-07-14 DIAGNOSIS — M5116 Intervertebral disc disorders with radiculopathy, lumbar region: Secondary | ICD-10-CM | POA: Insufficient documentation

## 2021-07-14 DIAGNOSIS — G709 Myoneural disorder, unspecified: Secondary | ICD-10-CM | POA: Insufficient documentation

## 2021-07-14 DIAGNOSIS — M47816 Spondylosis without myelopathy or radiculopathy, lumbar region: Secondary | ICD-10-CM | POA: Diagnosis not present

## 2021-07-14 DIAGNOSIS — I10 Essential (primary) hypertension: Secondary | ICD-10-CM | POA: Insufficient documentation

## 2021-07-14 DIAGNOSIS — G4730 Sleep apnea, unspecified: Secondary | ICD-10-CM | POA: Insufficient documentation

## 2021-07-14 DIAGNOSIS — M5126 Other intervertebral disc displacement, lumbar region: Secondary | ICD-10-CM

## 2021-07-14 DIAGNOSIS — Z79899 Other long term (current) drug therapy: Secondary | ICD-10-CM | POA: Insufficient documentation

## 2021-07-14 DIAGNOSIS — J45909 Unspecified asthma, uncomplicated: Secondary | ICD-10-CM | POA: Insufficient documentation

## 2021-07-14 DIAGNOSIS — M48061 Spinal stenosis, lumbar region without neurogenic claudication: Secondary | ICD-10-CM | POA: Insufficient documentation

## 2021-07-14 DIAGNOSIS — Z6841 Body Mass Index (BMI) 40.0 and over, adult: Secondary | ICD-10-CM | POA: Diagnosis not present

## 2021-07-14 HISTORY — PX: LUMBAR LAMINECTOMY/DECOMPRESSION MICRODISCECTOMY: SHX5026

## 2021-07-14 LAB — URINALYSIS, COMPLETE (UACMP) WITH MICROSCOPIC
Bilirubin Urine: NEGATIVE
Glucose, UA: NEGATIVE mg/dL
Ketones, ur: NEGATIVE mg/dL
Leukocytes,Ua: NEGATIVE
Nitrite: NEGATIVE
Protein, ur: 30 mg/dL — AB
Specific Gravity, Urine: 1.028 (ref 1.005–1.030)
pH: 5 (ref 5.0–8.0)

## 2021-07-14 LAB — BASIC METABOLIC PANEL
Anion gap: 10 (ref 5–15)
BUN: 14 mg/dL (ref 6–20)
CO2: 21 mmol/L — ABNORMAL LOW (ref 22–32)
Calcium: 9 mg/dL (ref 8.9–10.3)
Chloride: 105 mmol/L (ref 98–111)
Creatinine, Ser: 0.96 mg/dL (ref 0.61–1.24)
GFR, Estimated: >60 mL/min (ref >60)
Glucose, Bld: 98 mg/dL (ref 70–99)
Potassium: 4.7 mmol/L (ref 3.5–5.1)
Sodium: 136 mmol/L (ref 135–145)

## 2021-07-14 LAB — CBC
HCT: 44.2 % (ref 39.0–52.0)
Hemoglobin: 15 g/dL (ref 13.0–17.0)
MCH: 28.5 pg (ref 26.0–34.0)
MCHC: 33.9 g/dL (ref 30.0–36.0)
MCV: 83.9 fL (ref 80.0–100.0)
Platelets: 168 10*3/uL (ref 150–400)
RBC: 5.27 MIL/uL (ref 4.22–5.81)
RDW: 13.7 % (ref 11.5–15.5)
WBC: 11.5 10*3/uL — ABNORMAL HIGH (ref 4.0–10.5)
nRBC: 0 % (ref 0.0–0.2)

## 2021-07-14 LAB — SURGICAL PCR SCREEN
MRSA, PCR: NEGATIVE
Staphylococcus aureus: POSITIVE — AB

## 2021-07-14 SURGERY — LUMBAR LAMINECTOMY/DECOMPRESSION MICRODISCECTOMY 1 LEVEL
Anesthesia: General | Site: Spine Lumbar

## 2021-07-14 MED ORDER — BISACODYL 5 MG PO TBEC: 5.0000 mg | DELAYED_RELEASE_TABLET | Freq: Every day | ORAL | Status: DC | PRN

## 2021-07-14 MED ORDER — BUPIVACAINE-EPINEPHRINE 0.5% -1:200000 IJ SOLN
INTRAMUSCULAR | Status: AC
  Filled 2021-07-14: qty 1

## 2021-07-14 MED ORDER — TRANEXAMIC ACID-NACL 1000-0.7 MG/100ML-% IV SOLN
1000.0000 mg | INTRAVENOUS | Status: AC
  Administered 2021-07-14: 1000 mg via INTRAVENOUS
  Filled 2021-07-14: qty 100

## 2021-07-14 MED ORDER — ACETAMINOPHEN 325 MG PO TABS: 650.0000 mg | ORAL_TABLET | ORAL | Status: DC | PRN

## 2021-07-14 MED ORDER — ONDANSETRON HCL 4 MG/2ML IJ SOLN: 4.0000 mg | Freq: Once | INTRAMUSCULAR | Status: DC | PRN

## 2021-07-14 MED ORDER — TRANEXAMIC ACID 1000 MG/10ML IV SOLN
2000.0000 mg | Freq: Once | INTRAVENOUS | Status: DC
  Filled 2021-07-14: qty 20

## 2021-07-14 MED ORDER — FENTANYL CITRATE (PF) 250 MCG/5ML IJ SOLN
INTRAMUSCULAR | Status: AC
  Filled 2021-07-14: qty 5

## 2021-07-14 MED ORDER — DOCUSATE SODIUM 100 MG PO CAPS: 100.0000 mg | ORAL_CAPSULE | Freq: Two times a day (BID) | ORAL | Status: DC

## 2021-07-14 MED ORDER — MIDAZOLAM HCL 2 MG/2ML IJ SOLN
INTRAMUSCULAR | Status: AC
  Filled 2021-07-14: qty 2

## 2021-07-14 MED ORDER — ORAL CARE MOUTH RINSE: 15.0000 mL | Freq: Once | OROMUCOSAL | Status: AC

## 2021-07-14 MED ORDER — KETAMINE HCL 10 MG/ML IJ SOLN
INTRAMUSCULAR | Status: DC | PRN
  Administered 2021-07-14: 20 mg via INTRAVENOUS
  Administered 2021-07-14 (×3): 10 mg via INTRAVENOUS

## 2021-07-14 MED ORDER — TRANEXAMIC ACID 1000 MG/10ML IV SOLN
INTRAVENOUS | Status: DC | PRN
  Administered 2021-07-14: 2000 mg via TOPICAL

## 2021-07-14 MED ORDER — OXYCODONE HCL 5 MG PO TABS
5.0000 mg | ORAL_TABLET | ORAL | Status: DC | PRN
  Administered 2021-07-14: 5 mg via ORAL
  Filled 2021-07-14: qty 1

## 2021-07-14 MED ORDER — SUGAMMADEX SODIUM 200 MG/2ML IV SOLN
INTRAVENOUS | Status: DC | PRN
  Administered 2021-07-14: 400 mg via INTRAVENOUS

## 2021-07-14 MED ORDER — CEPHALEXIN 500 MG PO CAPS: 500.0000 mg | ORAL_CAPSULE | Freq: Two times a day (BID) | ORAL | 0 refills | Status: AC

## 2021-07-14 MED ORDER — ALUM & MAG HYDROXIDE-SIMETH 200-200-20 MG/5ML PO SUSP: 30.0000 mL | Freq: Four times a day (QID) | ORAL | Status: DC | PRN

## 2021-07-14 MED ORDER — OXYCODONE HCL 10 MG PO TABS: 10.0000 mg | ORAL_TABLET | ORAL | 0 refills | Status: AC | PRN

## 2021-07-14 MED ORDER — LACTATED RINGERS IV SOLN: INTRAVENOUS | Status: DC

## 2021-07-14 MED ORDER — LISINOPRIL 20 MG PO TABS: 20.0000 mg | ORAL_TABLET | Freq: Every day | ORAL | Status: DC

## 2021-07-14 MED ORDER — OXYCODONE HCL 5 MG/5ML PO SOLN: 5.0000 mg | Freq: Once | ORAL | Status: DC | PRN

## 2021-07-14 MED ORDER — FLUTICASONE FUROATE-VILANTEROL 100-25 MCG/ACT IN AEPB
1.0000 | INHALATION_SPRAY | Freq: Every day | RESPIRATORY_TRACT | Status: DC
  Filled 2021-07-14: qty 28

## 2021-07-14 MED ORDER — CEFAZOLIN IN SODIUM CHLORIDE 3-0.9 GM/100ML-% IV SOLN
INTRAVENOUS | Status: AC
  Filled 2021-07-14: qty 100

## 2021-07-14 MED ORDER — HYDROMORPHONE HCL 1 MG/ML IJ SOLN: 0.5000 mg | INTRAMUSCULAR | Status: DC | PRN

## 2021-07-14 MED ORDER — FLUTICASONE PROPIONATE 50 MCG/ACT NA SUSP
1.0000 | Freq: Every morning | NASAL | Status: DC
Start: 2021-07-15 — End: 2021-07-15
  Filled 2021-07-14: qty 16

## 2021-07-14 MED ORDER — PROPOFOL 10 MG/ML IV BOLUS
INTRAVENOUS | Status: AC
  Filled 2021-07-14: qty 20

## 2021-07-14 MED ORDER — HYDROCHLOROTHIAZIDE 25 MG PO TABS: 25.0000 mg | ORAL_TABLET | Freq: Every day | ORAL | Status: DC

## 2021-07-14 MED ORDER — ACETAMINOPHEN 10 MG/ML IV SOLN: 1000.0000 mg | Freq: Once | INTRAVENOUS | Status: DC | PRN

## 2021-07-14 MED ORDER — SUCCINYLCHOLINE CHLORIDE 200 MG/10ML IV SOSY
PREFILLED_SYRINGE | INTRAVENOUS | Status: AC
  Filled 2021-07-14: qty 10

## 2021-07-14 MED ORDER — MIDAZOLAM HCL 2 MG/2ML IJ SOLN
INTRAMUSCULAR | Status: DC | PRN
  Administered 2021-07-14: 2 mg via INTRAVENOUS

## 2021-07-14 MED ORDER — FENTANYL CITRATE (PF) 250 MCG/5ML IJ SOLN
INTRAMUSCULAR | Status: DC | PRN
  Administered 2021-07-14 (×3): 50 ug via INTRAVENOUS
  Administered 2021-07-14 (×2): 100 ug via INTRAVENOUS

## 2021-07-14 MED ORDER — ACETAMINOPHEN 10 MG/ML IV SOLN
1000.0000 mg | INTRAVENOUS | Status: AC
  Administered 2021-07-14: 1000 mg via INTRAVENOUS
  Filled 2021-07-14: qty 100

## 2021-07-14 MED ORDER — KETAMINE HCL 50 MG/5ML IJ SOSY
PREFILLED_SYRINGE | INTRAMUSCULAR | Status: AC
  Filled 2021-07-14: qty 5

## 2021-07-14 MED ORDER — BUPIVACAINE-EPINEPHRINE 0.5% -1:200000 IJ SOLN
INTRAMUSCULAR | Status: DC | PRN
  Administered 2021-07-14: 3 mL

## 2021-07-14 MED ORDER — PROPOFOL 10 MG/ML IV BOLUS
INTRAVENOUS | Status: DC | PRN
  Administered 2021-07-14: 200 mg via INTRAVENOUS

## 2021-07-14 MED ORDER — HYDROMORPHONE HCL 1 MG/ML IJ SOLN: 0.2500 mg | INTRAMUSCULAR | Status: DC | PRN

## 2021-07-14 MED ORDER — ROCURONIUM BROMIDE 10 MG/ML (PF) SYRINGE
PREFILLED_SYRINGE | INTRAVENOUS | Status: AC
  Filled 2021-07-14: qty 10

## 2021-07-14 MED ORDER — SUCCINYLCHOLINE CHLORIDE 200 MG/10ML IV SOSY
PREFILLED_SYRINGE | INTRAVENOUS | Status: DC | PRN
  Administered 2021-07-14: 180 mg via INTRAVENOUS

## 2021-07-14 MED ORDER — PHENOL 1.4 % MT LIQD: 1.0000 | OROMUCOSAL | Status: DC | PRN

## 2021-07-14 MED ORDER — LISINOPRIL-HYDROCHLOROTHIAZIDE 20-25 MG PO TABS: 1.0000 | ORAL_TABLET | Freq: Every morning | ORAL | Status: DC

## 2021-07-14 MED ORDER — ONDANSETRON HCL 4 MG PO TABS: 4.0000 mg | ORAL_TABLET | Freq: Four times a day (QID) | ORAL | Status: DC | PRN

## 2021-07-14 MED ORDER — CHLORHEXIDINE GLUCONATE 0.12 % MT SOLN
15.0000 mL | Freq: Once | OROMUCOSAL | Status: AC
  Administered 2021-07-14: 15 mL via OROMUCOSAL
  Filled 2021-07-14: qty 15

## 2021-07-14 MED ORDER — KCL IN DEXTROSE-NACL 20-5-0.45 MEQ/L-%-% IV SOLN: INTRAVENOUS | Status: DC

## 2021-07-14 MED ORDER — THROMBIN 20000 UNITS EX SOLR
CUTANEOUS | Status: DC | PRN
  Administered 2021-07-14: 20 mL via TOPICAL

## 2021-07-14 MED ORDER — ONDANSETRON HCL 4 MG/2ML IJ SOLN
INTRAMUSCULAR | Status: DC | PRN
  Administered 2021-07-14: 4 mg via INTRAVENOUS

## 2021-07-14 MED ORDER — POLYETHYLENE GLYCOL 3350 17 G PO PACK: 17.0000 g | PACK | Freq: Every day | ORAL | 0 refills | Status: DC

## 2021-07-14 MED ORDER — METHOCARBAMOL 1000 MG/10ML IJ SOLN: 500.0000 mg | Freq: Four times a day (QID) | INTRAVENOUS | Status: DC | PRN

## 2021-07-14 MED ORDER — THROMBIN 20000 UNITS EX SOLR
CUTANEOUS | Status: AC
  Filled 2021-07-14: qty 20000

## 2021-07-14 MED ORDER — OXYCODONE HCL 5 MG PO TABS: 10.0000 mg | ORAL_TABLET | ORAL | Status: DC | PRN

## 2021-07-14 MED ORDER — ONDANSETRON HCL 4 MG/2ML IJ SOLN: 4.0000 mg | Freq: Four times a day (QID) | INTRAMUSCULAR | Status: DC | PRN

## 2021-07-14 MED ORDER — 0.9 % SODIUM CHLORIDE (POUR BTL) OPTIME
TOPICAL | Status: DC | PRN
  Administered 2021-07-14: 1000 mL

## 2021-07-14 MED ORDER — AMISULPRIDE (ANTIEMETIC) 5 MG/2ML IV SOLN: 10.0000 mg | Freq: Once | INTRAVENOUS | Status: DC | PRN

## 2021-07-14 MED ORDER — ONDANSETRON HCL 4 MG/2ML IJ SOLN
INTRAMUSCULAR | Status: AC
  Filled 2021-07-14: qty 2

## 2021-07-14 MED ORDER — CEFAZOLIN IN SODIUM CHLORIDE 3-0.9 GM/100ML-% IV SOLN
3.0000 g | INTRAVENOUS | Status: AC
  Administered 2021-07-14: 3 g via INTRAVENOUS

## 2021-07-14 MED ORDER — LORATADINE 10 MG PO TABS: 10.0000 mg | ORAL_TABLET | Freq: Every day | ORAL | Status: DC

## 2021-07-14 MED ORDER — ACETAMINOPHEN 500 MG PO TABS: 1000.0000 mg | ORAL_TABLET | Freq: Four times a day (QID) | ORAL | Status: DC

## 2021-07-14 MED ORDER — OXYCODONE HCL 5 MG PO TABS: 5.0000 mg | ORAL_TABLET | Freq: Once | ORAL | Status: DC | PRN

## 2021-07-14 MED ORDER — METHOCARBAMOL 500 MG PO TABS
500.0000 mg | ORAL_TABLET | Freq: Four times a day (QID) | ORAL | Status: DC | PRN
  Administered 2021-07-14: 500 mg via ORAL
  Filled 2021-07-14: qty 1

## 2021-07-14 MED ORDER — MONTELUKAST SODIUM 10 MG PO TABS: 10.0000 mg | ORAL_TABLET | Freq: Every day | ORAL | Status: DC

## 2021-07-14 MED ORDER — DOCUSATE SODIUM 100 MG PO CAPS: 100.0000 mg | ORAL_CAPSULE | Freq: Two times a day (BID) | ORAL | 1 refills | Status: DC | PRN

## 2021-07-14 MED ORDER — DEXAMETHASONE SODIUM PHOSPHATE 10 MG/ML IJ SOLN
INTRAMUSCULAR | Status: DC | PRN
  Administered 2021-07-14: 10 mg via INTRAVENOUS

## 2021-07-14 MED ORDER — GABAPENTIN 300 MG PO CAPS: 300.0000 mg | ORAL_CAPSULE | Freq: Two times a day (BID) | ORAL | Status: DC | PRN

## 2021-07-14 MED ORDER — LIDOCAINE 2% (20 MG/ML) 5 ML SYRINGE
INTRAMUSCULAR | Status: AC
  Filled 2021-07-14: qty 5

## 2021-07-14 MED ORDER — ROCURONIUM BROMIDE 10 MG/ML (PF) SYRINGE
PREFILLED_SYRINGE | INTRAVENOUS | Status: DC | PRN
  Administered 2021-07-14: 30 mg via INTRAVENOUS

## 2021-07-14 MED ORDER — RISAQUAD PO CAPS
1.0000 | ORAL_CAPSULE | Freq: Every day | ORAL | Status: DC
  Administered 2021-07-14: 1 via ORAL
  Filled 2021-07-14: qty 1

## 2021-07-14 MED ORDER — MENTHOL 3 MG MT LOZG: 1.0000 | LOZENGE | OROMUCOSAL | Status: DC | PRN

## 2021-07-14 MED ORDER — POLYETHYLENE GLYCOL 3350 17 G PO PACK: 17.0000 g | PACK | Freq: Every day | ORAL | Status: DC | PRN

## 2021-07-14 MED ORDER — ASPIRIN 81 MG PO TBEC: 81.0000 mg | DELAYED_RELEASE_TABLET | Freq: Every day | ORAL | 1 refills | Status: AC

## 2021-07-14 MED ORDER — ACETAMINOPHEN 650 MG RE SUPP: 650.0000 mg | RECTAL | Status: DC | PRN

## 2021-07-14 MED ORDER — LIDOCAINE 2% (20 MG/ML) 5 ML SYRINGE
INTRAMUSCULAR | Status: DC | PRN
  Administered 2021-07-14: 100 mg via INTRAVENOUS

## 2021-07-14 MED ORDER — CEFAZOLIN SODIUM-DEXTROSE 2-4 GM/100ML-% IV SOLN
2.0000 g | Freq: Three times a day (TID) | INTRAVENOUS | Status: DC
  Administered 2021-07-14: 2 g via INTRAVENOUS
  Filled 2021-07-14: qty 100

## 2021-07-14 MED ORDER — DEXAMETHASONE SODIUM PHOSPHATE 10 MG/ML IJ SOLN
INTRAMUSCULAR | Status: AC
  Filled 2021-07-14: qty 1

## 2021-07-14 SURGICAL SUPPLY — 67 items
BAG COUNTER SPONGE SURGICOUNT (BAG) ×2 IMPLANT
BAG DECANTER FOR FLEXI CONT (MISCELLANEOUS) IMPLANT
BAG SPNG CNTER NS LX DISP (BAG) ×1
BAND INSRT 18 STRL LF DISP RB (MISCELLANEOUS) ×2
BAND RUBBER #18 3X1/16 STRL (MISCELLANEOUS) ×4 IMPLANT
BUR EGG ELITE 5.0 (BURR) ×1 IMPLANT
BUR RND DIAMOND ELITE 4.0 (BURR) ×1 IMPLANT
BUR STRYKR EGG 5.0 (BURR) IMPLANT
CARTRIDGE OIL MAESTRO DRILL (MISCELLANEOUS) IMPLANT
CLEANER TIP ELECTROSURG 2X2 (MISCELLANEOUS) ×2 IMPLANT
CNTNR URN SCR LID CUP LEK RST (MISCELLANEOUS) ×1 IMPLANT
CONT SPEC 4OZ STRL OR WHT (MISCELLANEOUS) ×2
DIFFUSER DRILL AIR PNEUMATIC (MISCELLANEOUS) IMPLANT
DRAPE LAPAROTOMY 100X72X124 (DRAPES) ×2 IMPLANT
DRAPE MICROSCOPE LEICA (MISCELLANEOUS) ×2 IMPLANT
DRAPE SHEET LG 3/4 BI-LAMINATE (DRAPES) ×2 IMPLANT
DRAPE SURG 17X11 SM STRL (DRAPES) ×2 IMPLANT
DRAPE UTILITY XL STRL (DRAPES) ×2 IMPLANT
DRSG AQUACEL AG ADV 3.5X 4 (GAUZE/BANDAGES/DRESSINGS) ×1 IMPLANT
DRSG AQUACEL AG ADV 3.5X 6 (GAUZE/BANDAGES/DRESSINGS) IMPLANT
DRSG TELFA 3X8 NADH (GAUZE/BANDAGES/DRESSINGS) IMPLANT
DURAPREP 26ML APPLICATOR (WOUND CARE) ×2 IMPLANT
DURASEAL SPINE SEALANT 3ML (MISCELLANEOUS) IMPLANT
ELECT BLADE 4.0 EZ CLEAN MEGAD (MISCELLANEOUS)
ELECT REM PT RETURN 9FT ADLT (ELECTROSURGICAL) ×2
ELECTRODE BLDE 4.0 EZ CLN MEGD (MISCELLANEOUS) IMPLANT
ELECTRODE REM PT RTRN 9FT ADLT (ELECTROSURGICAL) ×1 IMPLANT
GLOVE BIOGEL PI IND STRL 7.0 (GLOVE) IMPLANT
GLOVE BIOGEL PI IND STRL 7.5 (GLOVE) ×1 IMPLANT
GLOVE BIOGEL PI INDICATOR 7.0 (GLOVE) ×2
GLOVE BIOGEL PI INDICATOR 7.5 (GLOVE) ×1
GLOVE SS BIOGEL STRL SZ 7 (GLOVE) ×1 IMPLANT
GLOVE SS BIOGEL STRL SZ 8 (GLOVE) ×2 IMPLANT
GLOVE SUPERSENSE BIOGEL SZ 7 (GLOVE) ×1
GLOVE SUPERSENSE BIOGEL SZ 8 (GLOVE) ×2
GOWN STRL REUS W/ TWL LRG LVL3 (GOWN DISPOSABLE) ×1 IMPLANT
GOWN STRL REUS W/ TWL XL LVL3 (GOWN DISPOSABLE) ×1 IMPLANT
GOWN STRL REUS W/TWL LRG LVL3 (GOWN DISPOSABLE) ×2
GOWN STRL REUS W/TWL XL LVL3 (GOWN DISPOSABLE) ×2
IV CATH 14GX2 1/4 (CATHETERS) ×2 IMPLANT
KIT BASIN OR (CUSTOM PROCEDURE TRAY) ×2 IMPLANT
NDL SPNL 18GX3.5 QUINCKE PK (NEEDLE) ×2 IMPLANT
NEEDLE 22X1 1/2 (OR ONLY) (NEEDLE) ×2 IMPLANT
NEEDLE SPNL 18GX3.5 QUINCKE PK (NEEDLE) ×4 IMPLANT
OIL CARTRIDGE MAESTRO DRILL (MISCELLANEOUS)
PACK LAMINECTOMY NEURO (CUSTOM PROCEDURE TRAY) ×2 IMPLANT
PAD DRESSING TELFA 3X8 NADH (GAUZE/BANDAGES/DRESSINGS) IMPLANT
PATTIES SURGICAL .75X.75 (GAUZE/BANDAGES/DRESSINGS) ×2 IMPLANT
SPONGE SURGIFOAM ABS GEL 100 (HEMOSTASIS) ×2 IMPLANT
SPONGE T-LAP 4X18 ~~LOC~~+RFID (SPONGE) ×1 IMPLANT
STAPLER VISISTAT (STAPLE) ×1 IMPLANT
STAPLER VISISTAT 35W (STAPLE) IMPLANT
STRIP CLOSURE SKIN 1/2X4 (GAUZE/BANDAGES/DRESSINGS) ×2 IMPLANT
SUT NURALON 4 0 TR CR/8 (SUTURE) IMPLANT
SUT PROLENE 3 0 PS 2 (SUTURE) ×1 IMPLANT
SUT VIC AB 1 CT1 27 (SUTURE)
SUT VIC AB 1 CT1 27XBRD ANTBC (SUTURE) IMPLANT
SUT VIC AB 1-0 CT2 27 (SUTURE) ×1 IMPLANT
SUT VIC AB 2-0 CT1 27 (SUTURE)
SUT VIC AB 2-0 CT1 TAPERPNT 27 (SUTURE) IMPLANT
SUT VIC AB 2-0 CT2 27 (SUTURE) ×1 IMPLANT
SYR 3ML LL SCALE MARK (SYRINGE) ×2 IMPLANT
TOWEL GREEN STERILE (TOWEL DISPOSABLE) ×2 IMPLANT
TOWEL GREEN STERILE FF (TOWEL DISPOSABLE) ×2 IMPLANT
TRAY FOLEY MTR SLVR 16FR STAT (SET/KITS/TRAYS/PACK) ×2 IMPLANT
WIPE CHG CHLORHEXIDINE 2% (PERSONAL CARE ITEMS) ×2 IMPLANT
YANKAUER SUCT BULB TIP NO VENT (SUCTIONS) ×2 IMPLANT

## 2021-07-14 NOTE — Interval H&P Note (Signed)
History and Physical Interval Note:  07/14/2021 10:08 AM  Robert Kaufman  has presented today for surgery, with the diagnosis of Herniated disc L4-5.  The various methods of treatment have been discussed with the patient and family. After consideration of risks, benefits and other options for treatment, the patient has consented to  Procedure(s) with comments: Revision microlumbar laminectomy and discectomy L4-5 (N/A) - 2 hrs 3 C-bed as a surgical intervention.  The patient's history has been reviewed, patient examined, no change in status, stable for surgery.  I have reviewed the patient's chart and labs.  Questions were answered to the patient's satisfaction.     Johnn Hai

## 2021-07-14 NOTE — Anesthesia Procedure Notes (Signed)
Procedure Name: Intubation Date/Time: 07/14/2021 10:40 AM  Performed by: Gaylene Brooks, CRNAPre-anesthesia Checklist: Patient identified, Emergency Drugs available, Suction available and Patient being monitored Patient Re-evaluated:Patient Re-evaluated prior to induction Oxygen Delivery Method: Circle System Utilized Preoxygenation: Pre-oxygenation with 100% oxygen Induction Type: IV induction Ventilation: Oral airway inserted - appropriate to patient size Laryngoscope Size: Sabra Heck and 2 Grade View: Grade I Tube type: Oral Tube size: 7.5 mm Number of attempts: 1 Airway Equipment and Method: Stylet and Oral airway Placement Confirmation: ETT inserted through vocal cords under direct vision, positive ETCO2 and breath sounds checked- equal and bilateral Secured at: 23 cm Tube secured with: Tape Dental Injury: Teeth and Oropharynx as per pre-operative assessment

## 2021-07-14 NOTE — Progress Notes (Signed)
Discharged instructions/education/AVS/Rx given to patient and verbalized understanding. MAE well, ambulating well with supervision. Dressing CDI, No drainage, no swelling, no redness noted on incision site. Pain is mild to moderate and controlled by PRN pain medication. Voiding and emptying bladder well. Tolerated dinner with no complaints. Ambulated with NT to his transport waiting at the main entrance.

## 2021-07-14 NOTE — Discharge Instructions (Signed)

## 2021-07-14 NOTE — Brief Op Note (Signed)
07/14/2021  10:14 AM  PATIENT:  Robert Kaufman  51 y.o. male  PRE-OPERATIVE DIAGNOSIS:  Herniated disc L4-5  POST-OPERATIVE DIAGNOSIS:  * No post-op diagnosis entered *  PROCEDURE:  Procedure(s) with comments: Revision microlumbar laminectomy and discectomy L4-5 (N/A) - 2 hrs 3 C-bed  SURGEON:  Surgeon(s) and Role:    Susa Day, MD - Primary  PHYSICIAN ASSISTANT:   ASSISTANTS: [place]   ANESTHESIA:   general  EBL:  150  BLOOD ADMINISTERED:none  DRAINS: none   LOCAL MEDICATIONS USED:  MARCAINE     SPECIMEN:  No Specimen  DISPOSITION OF SPECIMEN:  N/A  COUNTS:  YES  TOURNIQUET:  * No tourniquets in log *  DICTATION: .Other Dictation: Dictation Number [id number]  PLAN OF CARE: Admit for overnight observation  PATIENT DISPOSITION:  PACU - hemodynamically stable.   Delay start of Pharmacological VTE agent (>24hrs) due to surgical blood loss or risk of bleeding: yes

## 2021-07-14 NOTE — Anesthesia Postprocedure Evaluation (Signed)
Anesthesia Post Note  Patient: Robert Kaufman  Procedure(s) Performed: Revision microlumbar laminectomy and discectomy Lumbar four-five (Spine Lumbar)     Anesthesia Post Evaluation No notable events documented.  Last Vitals:  Vitals:   07/14/21 1345 07/14/21 1415  BP: (!) 153/96 110/77  Pulse: 89 84  Resp: 19 12  Temp:    SpO2: 100% 97%    Last Pain:  Vitals:   07/14/21 1345  TempSrc:   PainSc: 0-No pain                 Barnet Glasgow

## 2021-07-14 NOTE — Transfer of Care (Signed)
Immediate Anesthesia Transfer of Care Note  Patient: Robert Kaufman  Procedure(s) Performed: Revision microlumbar laminectomy and discectomy Lumbar four-five (Spine Lumbar)  Patient Location: PACU  Anesthesia Type:General  Level of Consciousness: awake, drowsy and patient cooperative  Airway & Oxygen Therapy: Patient Spontanous Breathing and Patient connected to face mask oxygen  Post-op Assessment: Report given to RN, Post -op Vital signs reviewed and stable and Patient moving all extremities X 4  Post vital signs: Reviewed and stable  Last Vitals:  Vitals Value Taken Time  BP 153/96 07/14/21 1345  Temp    Pulse 85 07/14/21 1346  Resp 19 07/14/21 1346  SpO2 99 % 07/14/21 1346  Vitals shown include unvalidated device data.  Last Pain:  Vitals:   07/14/21 0855  TempSrc:   PainSc: 4          Complications: No notable events documented.

## 2021-07-14 NOTE — Op Note (Signed)
NAMEGUILLERMO, Kaufman MEDICAL RECORD NO: [id number] ACCOUNT NO: [id number] DATE OF BIRTH: 11/03/1970 FACILITY: [facility] LOCATION: [facility] PHYSICIAN: Johnn Hai, MD  Operative Report   DATE OF PROCEDURE: 07/14/2021  PREOPERATIVE DIAGNOSIS:  Spinal stenosis, recurrent disk herniation at L4-L5.  Elevated BMI of 40.44.  POSTOPERATIVE DIAGNOSES:  Spinal stenosis, recurrent disk herniation at L4-L5.  Elevated BMI of 40.44, epidural venous plexus.  PROCEDURE PERFORMED:   1.  Revision microlumbar decompression, L4-L5, left with hemilaminotomy at L4-L5. 2.  Partial medial facetectomy at L4-L5 with foraminotomies L4 and L5 and microdiskectomy L4-L5, left and lysis of epidural venous plexus.  ANESTHESIA:  General.  ASSISTANT:  Lacie Draft, PA.  Technical difficulty increased due to the patient's elevated BMI.  INDICATIONS:  A 51 year old with refractory L5 radicular pain with neural tension signs and dorsiflexion weakness.  Indicated for decompression L4-L5 due to the stenosis and a HNP.  Risks and benefits were discussed including bleeding, infection, damage  to neurovascular structures, no change in symptoms, worsening symptoms, DVT, PE, anesthetic complications, etc.  DESCRIPTION OF PROCEDURE:  With the patient in supine position.  After induction of adequate general anesthesia, 3 grams Kefzol he was placed prone on the Wilson frame.  All bony prominences were well padded.  Foley to gravity.  Lumbar region was prepped  and draped in the usual sterile fashion.  Two 18-gauge spinal needles utilized to localize the 4-5 interspace at the cephalad to caudad edge of his previous incision.  After prep and drape incision was made from the spinous process 4-5.  Subcutaneous  tissue was dissected.  Electrocautery was utilized to achieve hemostasis.  The patient had ample subcutaneous adipose layer.  The dorsal lumbar fascia was divided in line with skin incision.  Paraspinous muscle elevated from lamina  of L4-L5 on the left.   The extra-long McCulloch retractors were placed.  A small interlaminar window was noted at L4-L5 on the left.  A high-speed bur was utilized to perform hemilaminotomy of the caudad edge of 4.  I used a Leksell and a 3 mm Kerrison to continue the  laminotomy cephalad to the point detaching the ligamentum flavum.  High-speed bur was utilized to remove approximately 30% of the inferior articulating process of 4.  The superior articulating process of 5 was identified.  Again, a small interlaminar  window and hypertrophic ligamentum flavum.  We used a straight curette to detach ligamentum flavum from the cephalad edge of 5.  The patient had some intermittent bleeding that was controlled with electrocautery.  After detaching the ligamentum flavum  and identifying the medial border of the superior articulating process I used a 2 mm Kerrison to begin removing the medial aspect of the superior articulating process.  This extended out laterally.  I identified the lateral aspect of the thecal sac and  the 5 root.  I continued to decompress the lateral recess to medial border of the pedicle, protecting the L5 nerve root.  There was hypertrophic epidural venous plexus tethering the 5 root.  This was cauterized and divided.  Multiple vascular leash  noted.  Gently mobilized the 5 root medially.  I performed a full foraminotomy of L5 with a 2 mm Kerrison, again protecting the neural elements at all times.  The L5 root was noted to be flattened in the lateral recess.  The disk was noted there was a  small free fragment that was excised.  However, the majority of the disk was a calcified disk with a slight prominence.  I performed a foraminotomy of L4 as well.  Electrocautery was utilized to achieve hemostasis as was bone wax.  I performed a generous  foraminotomy of L5 and lysing the epidural venous plexus there was good restoration of the L5 nerve root.  There was 1 cm of excursion of the 5 root  medial to the pedicle without tension.  We used thrombin-soaked Gelfoam and TXA-soaked patties to  achieve hemostasis throughout the case.  He had steady intermittent bleeding.  This was cauterized when seen.  Final confirmatory radiograph with a Woodson in the foramen of 4.  I passed freely out the foramen of 4 and 5.  No active bleeding was noted or  CSF leakage.  After copious irrigation, I then placed a TXA in the wound and then evacuated it.  I felt the pathology had been addressed and the L5 nerve root was decompressed.  After copious irrigation, removed the McCulloch retractor, closed the  dorsal lumbar fascia with #1 Vicryl in interrupted figure-of-eight sutures, subcutaneous with multiple layers of 2-0, skin with staples.  Wound was dressed sterilely, placed supine on the hospital bed, extubated without difficulty and transported to the  recovery room in satisfactory condition.  The patient tolerated the procedure well.  No complications.  Assistant, Lacie Draft, [place], was used throughout the case for general intermittent neural retraction, suction, closure and patient positioning.  BLOOD LOSS:  150 mL   PUS D: 07/14/2021 1:40:57 pm T: 07/14/2021 4:22:00 pm  JOB: [id number]/ [id number]

## 2021-07-14 NOTE — Evaluation (Signed)
Occupational Therapy Evaluation Patient Details Name: Robert Kaufman MRN: [id number] DOB: 1970/05/24 Today's Date: 07/14/2021   History of Present Illness 51 yo M s/p revision microlumbar laminectomy and discectomy L4-5.  PMH includes: HTN, Allergy, Asthma, Hyperlipidemia, Sleep apnea.   Clinical Impression   Patient admitted for the procedure above.  This is a revision of a prior surgery.  Patient has a good understanding of all precautions, and is essentially at his baseline for ADL completion and mobility in the halls.  Patient was able to ascend 4 stairs without assist, and prefers to discharge home.  Post op soreness is the only deficit.  His spouse will be available to provide any needed assist.  All questions answered and no further needs in the acute setting.  Recommend follow up as prescribed by the MD.        Recommendations for follow up therapy are one component of a multi-disciplinary discharge planning process, led by the attending physician.  Recommendations may be updated based on patient status, additional functional criteria and insurance authorization.   Follow Up Recommendations  No OT follow up    Assistance Recommended at Discharge PRN  Patient can return home with the following      Functional Status Assessment  Patient has not had a recent decline in their functional status  Equipment Recommendations  None recommended by OT    Recommendations for Other Services       Precautions / Restrictions Precautions Precautions: Back Precaution Booklet Issued: Yes (comment) Precaution Comments: Reviewed and sheet issued as needed Restrictions Weight Bearing Restrictions: No      Mobility Bed Mobility Overal bed mobility: Modified Independent                  Transfers Overall transfer level: Modified independent                        Balance Overall balance assessment: No apparent balance deficits (not formally assessed)                                          ADL either performed or assessed with clinical judgement   ADL Overall ADL's : At baseline                                       General ADL Comments: a little sore, but spouse can assist as needed.     Vision Baseline Vision/History: 1 Wears glasses Patient Visual Report: No change from baseline       Perception Perception Perception: Not tested   Praxis Praxis Praxis: Not tested    Pertinent Vitals/Pain Pain Assessment Pain Assessment: Faces Faces Pain Scale: Hurts little more Pain Location: Incision Pain Descriptors / Indicators: Tender Pain Intervention(s): Monitored during session     Hand Dominance Right   Extremity/Trunk Assessment Upper Extremity Assessment Upper Extremity Assessment: Overall WFL for tasks assessed   Lower Extremity Assessment Lower Extremity Assessment: Overall WFL for tasks assessed   Cervical / Trunk Assessment Cervical / Trunk Assessment: Back Surgery   Communication Communication Communication: No difficulties   Cognition Arousal/Alertness: Awake/alert Behavior During Therapy: WFL for tasks assessed/performed Overall Cognitive Status: Within Functional Limits for tasks assessed  General Comments   VSS on RA    Exercises     Shoulder Instructions      Home Living Family/patient expects to be discharged to:: Private residence Living Arrangements: Spouse/significant other Available Help at Discharge: Family;Available 24 hours/day Type of Home: House Home Access: Stairs to enter [employer] of Steps: 2   Home Layout: Two level Alternate Level Stairs-Number of Steps: 14 Alternate Level Stairs-Rails: Right Bathroom Shower/Tub: Tub/shower unit;Walk-in shower   Bathroom Toilet: Standard     Home Equipment: None          Prior Functioning/Environment Prior Level of Function : Independent/Modified  Independent;Working/employed;Driving                        OT Problem List: Pain      OT Treatment/Interventions:      OT Goals(Current goals can be found in the care plan section) Acute Rehab OT Goals Patient Stated Goal: Return home OT Goal Formulation: With patient Time For Goal Achievement: 07/18/21 Potential to Achieve Goals: Good  OT Frequency:      Co-evaluation              AM-PAC OT "6 Clicks" Daily Activity     Outcome Measure Help from another person eating meals?: None Help from another person taking care of personal grooming?: None Help from another person toileting, which includes using toliet, bedpan, or urinal?: None Help from another person bathing (including washing, rinsing, drying)?: None Help from another person to put on and taking off regular upper body clothing?: None Help from another person to put on and taking off regular lower body clothing?: None 6 Click Score: 24   End of Session Nurse Communication: Mobility status  Activity Tolerance: Patient tolerated treatment well Patient left: in bed;with call bell/phone within reach  OT Visit Diagnosis: Pain Pain - Right/Left: Left Pain - part of body: Leg                Time: 0109-3235 OT Time Calculation (min): 20 min Charges:  OT General Charges $OT Visit: 1 Visit OT Evaluation $OT Eval Moderate Complexity: 1 Mod  07/14/2021  RP, OTR/L  Acute Rehabilitation Services  Office:  [phone]   Metta Clines 07/14/2021, 5:35 PM

## 2021-07-15 ENCOUNTER — Encounter (HOSPITAL_COMMUNITY): Payer: Self-pay | Admitting: Specialist

## 2021-07-15 LAB — URINE CULTURE
Culture: NO GROWTH
Special Requests: NORMAL

## 2021-12-16 LAB — BASIC METABOLIC PANEL
BUN: 16 (ref 4–21)
CO2: 28 — AB (ref 13–22)
Chloride: 103 (ref 99–108)
Creatinine: 1 (ref 0.6–1.3)
Glucose: 106
Potassium: 4 mEq/L (ref 3.5–5.1)
Sodium: 137 (ref 137–147)

## 2021-12-16 LAB — LIPID PANEL
Cholesterol: 136 (ref 0–200)
HDL: 41 (ref 35–70)
LDL Cholesterol: 68
LDl/HDL Ratio: 3.3
Triglycerides: 159 (ref 40–160)

## 2021-12-16 LAB — HEPATIC FUNCTION PANEL
ALT: 31 U/L (ref 10–40)
AST: 21 (ref 14–40)
Alkaline Phosphatase: 74 (ref 25–125)
Bilirubin, Total: 0.6

## 2021-12-16 LAB — CBC AND DIFFERENTIAL
HCT: 42 (ref 41–53)
Hemoglobin: 13.9 (ref 13.5–17.5)
WBC: 10.7

## 2021-12-16 LAB — COMPREHENSIVE METABOLIC PANEL
Albumin: 4.2 (ref 3.5–5.0)
Calcium: 9.6 (ref 8.7–10.7)
eGFR: 89

## 2021-12-16 LAB — LAB REPORT - SCANNED
A1c: 5.8
EGFR: 89

## 2021-12-16 LAB — HEMOGLOBIN A1C: Hemoglobin A1C: 5.8

## 2021-12-16 LAB — CBC: RBC: 5.03 (ref 3.87–5.11)

## 2021-12-16 LAB — TSH: TSH: 0.53 (ref 0.41–5.90)

## 2022-03-14 ENCOUNTER — Encounter: Payer: 59 | Admitting: Internal Medicine

## 2022-03-24 ENCOUNTER — Ambulatory Visit (INDEPENDENT_AMBULATORY_CARE_PROVIDER_SITE_OTHER): Payer: 59 | Admitting: Internal Medicine

## 2022-03-24 ENCOUNTER — Encounter: Payer: Self-pay | Admitting: Internal Medicine

## 2022-03-24 DIAGNOSIS — R7303 Prediabetes: Secondary | ICD-10-CM | POA: Diagnosis not present

## 2022-03-24 DIAGNOSIS — Z8249 Family history of ischemic heart disease and other diseases of the circulatory system: Secondary | ICD-10-CM

## 2022-03-24 DIAGNOSIS — E782 Mixed hyperlipidemia: Secondary | ICD-10-CM | POA: Diagnosis not present

## 2022-03-24 DIAGNOSIS — J309 Allergic rhinitis, unspecified: Secondary | ICD-10-CM

## 2022-03-24 DIAGNOSIS — I10 Essential (primary) hypertension: Secondary | ICD-10-CM

## 2022-03-24 MED ORDER — ZEPBOUND 7.5 MG/0.5ML ~~LOC~~ SOAJ
7.5000 mg | SUBCUTANEOUS | 0 refills | Status: DC
Start: 2022-03-24 — End: 2022-07-10

## 2022-03-24 MED ORDER — LISINOPRIL-HYDROCHLOROTHIAZIDE 20-25 MG PO TABS
1.0000 | ORAL_TABLET | Freq: Every morning | ORAL | 3 refills | Status: DC
Start: 2022-03-24 — End: 2022-09-27

## 2022-03-24 MED ORDER — ROSUVASTATIN CALCIUM 10 MG PO TABS: 10.0000 mg | ORAL_TABLET | Freq: Every day | ORAL | 3 refills | Status: DC

## 2022-03-24 MED ORDER — LISINOPRIL-HYDROCHLOROTHIAZIDE 20-25 MG PO TABS: 1.0000 | ORAL_TABLET | Freq: Every morning | ORAL | 3 refills | Status: DC

## 2022-03-24 MED ORDER — PANTOPRAZOLE SODIUM 40 MG PO TBEC: 40.0000 mg | DELAYED_RELEASE_TABLET | Freq: Every day | ORAL | 3 refills | Status: DC

## 2022-03-24 MED ORDER — ZEPBOUND 10 MG/0.5ML ~~LOC~~ SOAJ
10.0000 mg | SUBCUTANEOUS | 0 refills | Status: DC
Start: 2022-03-24 — End: 2022-07-11

## 2022-03-24 MED ORDER — ZEPBOUND 5 MG/0.5ML ~~LOC~~ SOAJ: 5.0000 mg | SUBCUTANEOUS | 0 refills | Status: DC

## 2022-03-24 MED ORDER — ZEPBOUND 2.5 MG/0.5ML ~~LOC~~ SOAJ
2.5000 mg | SUBCUTANEOUS | 0 refills | Status: DC
Start: 2022-03-24 — End: 2022-07-11

## 2022-03-24 NOTE — Assessment & Plan Note (Addendum)
Counseled about weight management and medication and lifestyle options. We discussed and he followed up that he would like to try zepbound which is prescribed at standard titration.

## 2022-03-24 NOTE — Assessment & Plan Note (Signed)
Well controlled with crestor 10 mg daily which is rxed today for continuity of care. Last lipid panel Dec 2023.

## 2022-03-24 NOTE — Assessment & Plan Note (Signed)
Taking zyrtec and flonase and singulair with decent results. Seeing allergy for management.

## 2022-03-24 NOTE — Progress Notes (Signed)
   Subjective:   Patient ID: Robert Kaufman, male    DOB: 01/22/1970, 52 y.o.   MRN: [id number]  HPI The patient is a new 52 YO man coming in for ongoing care. See a/p for details.   PMH, [facility], social history reviewed and updated  Review of Systems  Constitutional: Negative.   HENT: Negative.    Eyes: Negative.   Respiratory:  Negative for cough, chest tightness and shortness of breath.   Cardiovascular:  Negative for chest pain, palpitations and leg swelling.  Gastrointestinal:  Negative for abdominal distention, abdominal pain, constipation, diarrhea, nausea and vomiting.  Musculoskeletal: Negative.   Skin: Negative.   Neurological: Negative.   Psychiatric/Behavioral: Negative.      Objective:  Physical Exam Constitutional:      Appearance: He is well-developed. He is obese.  HENT:     Head: Normocephalic and atraumatic.  Cardiovascular:     Rate and Rhythm: Normal rate and regular rhythm.  Pulmonary:     Effort: Pulmonary effort is normal. No respiratory distress.     Breath sounds: Normal breath sounds. No wheezing or rales.  Abdominal:     General: Bowel sounds are normal. There is no distension.     Palpations: Abdomen is soft.     Tenderness: There is no abdominal tenderness. There is no rebound.  Musculoskeletal:     Cervical back: Normal range of motion.  Skin:    General: Skin is warm and dry.  Neurological:     Mental Status: He is alert and oriented to person, place, and time.     Coordination: Coordination normal.     Vitals:   03/24/22 1021 03/24/22 1024  BP: (!) 140/80 (!) 140/80  Pulse: 86   Temp: 98.6 F (37 C)   TempSrc: Oral   SpO2: 93%   Weight: (!) 328 lb (148.8 kg)   Height: 6\' 2"  (1.88 m)     Assessment & Plan:  Visit time 35 minutes in face to face communication with patient and coordination of care, additional 10 minutes spent in record review, coordination or care, ordering tests, communicating/referring to other healthcare  professionals, documenting in medical records all on the same day of the visit for total time 45 minutes spent on the visit.

## 2022-03-24 NOTE — Assessment & Plan Note (Signed)
BP is borderline today but reports normal at home. Working on weight loss. Continue lisinopril/hctz 20/25 and rx done today for continuity of care. Recent labs Dec 2023 will follow yearly at minimum.

## 2022-03-24 NOTE — Assessment & Plan Note (Signed)
Recent HgA1c Dec 2023 5.8 per patient and he desires to work on weight loss.

## 2022-03-24 NOTE — Patient Instructions (Addendum)
Zepbound and wegovy are the injection ones for the weight.  Metformin is the pre-diabetes medicine which can help with weight loss and insulin resistance.  B12 1000 mcg daily to help with the nerves.

## 2022-03-24 NOTE — Assessment & Plan Note (Signed)
Recent stress testing with cardiology 2020 and calcium score. Is on statin and BP at goal. Working on weight as well.

## 2022-04-13 ENCOUNTER — Encounter: Payer: Self-pay | Admitting: Internal Medicine

## 2022-05-09 ENCOUNTER — Other Ambulatory Visit: Payer: Self-pay

## 2022-05-09 ENCOUNTER — Telehealth: Payer: Self-pay | Admitting: Internal Medicine

## 2022-05-09 NOTE — Telephone Encounter (Signed)
Patient's wife called and said patient is currently on tirzepatide (ZEPBOUND) 2.5 MG/0.5ML Pen . He is due to start the 5 mg dosage, but they've called around to multiple pharmacies and none of them have that dose. Does Dr. Okey Dupre want him to stay on the same dose of 2.5 mg or have any other recommendations? Best callback is [phone].

## 2022-05-09 NOTE — Telephone Encounter (Signed)
Pt wife states she found the 5mg  Zepbound at the [facility] and was wanting to know if this can be sent into this pharmacy.

## 2022-05-10 MED ORDER — ZEPBOUND 5 MG/0.5ML ~~LOC~~ SOAJ: 5.0000 mg | SUBCUTANEOUS | 0 refills | Status: DC

## 2022-05-10 NOTE — Telephone Encounter (Signed)
OK to send.

## 2022-05-10 NOTE — Telephone Encounter (Signed)
Ok to send there.

## 2022-05-10 NOTE — Telephone Encounter (Signed)
Called pt inform rx sent to [facility].Marland KitchenRaechel Chute

## 2022-05-10 NOTE — Addendum Note (Signed)
Addended by: Deatra James on: 05/10/2022 11:34 AM   Modules accepted: Orders

## 2022-07-10 ENCOUNTER — Encounter: Payer: Self-pay | Admitting: Internal Medicine

## 2022-07-10 ENCOUNTER — Other Ambulatory Visit: Payer: Self-pay | Admitting: Internal Medicine

## 2022-07-10 MED ORDER — ZEPBOUND 7.5 MG/0.5ML ~~LOC~~ SOAJ: 7.5000 mg | SUBCUTANEOUS | 0 refills | Status: DC

## 2022-07-10 NOTE — Telephone Encounter (Signed)
Please advise for patient

## 2022-07-11 MED ORDER — ZEPBOUND 7.5 MG/0.5ML ~~LOC~~ SOAJ
7.5000 mg | SUBCUTANEOUS | 2 refills | Status: DC
Start: 2022-07-11 — End: 2022-12-04

## 2022-08-17 ENCOUNTER — Encounter (INDEPENDENT_AMBULATORY_CARE_PROVIDER_SITE_OTHER): Payer: Self-pay

## 2022-09-27 ENCOUNTER — Encounter: Payer: Self-pay | Admitting: Internal Medicine

## 2022-09-27 ENCOUNTER — Ambulatory Visit: Payer: 59 | Admitting: Internal Medicine

## 2022-09-27 VITALS — BP 118/64 | HR 75 | Temp 98.2°F | Ht 74.0 in | Wt 300.0 lb

## 2022-09-27 DIAGNOSIS — R7303 Prediabetes: Secondary | ICD-10-CM | POA: Diagnosis not present

## 2022-09-27 DIAGNOSIS — Z23 Encounter for immunization: Secondary | ICD-10-CM | POA: Diagnosis not present

## 2022-09-27 DIAGNOSIS — I10 Essential (primary) hypertension: Secondary | ICD-10-CM

## 2022-09-27 DIAGNOSIS — E782 Mixed hyperlipidemia: Secondary | ICD-10-CM | POA: Diagnosis not present

## 2022-09-27 LAB — COMPREHENSIVE METABOLIC PANEL
ALT: 24 U/L (ref 0–53)
AST: 19 U/L (ref 0–37)
Albumin: 4.3 g/dL (ref 3.5–5.2)
Alkaline Phosphatase: 67 U/L (ref 39–117)
BUN: 11 mg/dL (ref 6–23)
CO2: 26 mEq/L (ref 19–32)
Calcium: 9.5 mg/dL (ref 8.4–10.5)
Chloride: 101 mEq/L (ref 96–112)
Creatinine, Ser: 1.05 mg/dL (ref 0.40–1.50)
GFR: 81.59 mL/min (ref >60.00)
Glucose, Bld: 96 mg/dL (ref 70–99)
Potassium: 3.5 mEq/L (ref 3.5–5.1)
Sodium: 137 mEq/L (ref 135–145)
Total Bilirubin: 0.7 mg/dL (ref 0.2–1.2)
Total Protein: 7.2 g/dL (ref 6.0–8.3)

## 2022-09-27 LAB — CBC
HCT: 43.1 % (ref 39.0–52.0)
Hemoglobin: 14.4 g/dL (ref 13.0–17.0)
MCHC: 33.4 g/dL (ref 30.0–36.0)
MCV: 82.7 fl (ref 78.0–100.0)
Platelets: 174 10*3/uL (ref 150.0–400.0)
RBC: 5.21 Mil/uL (ref 4.22–5.81)
RDW: 14.1 % (ref 11.5–15.5)
WBC: 9.7 10*3/uL (ref 4.0–10.5)

## 2022-09-27 LAB — LIPID PANEL
Cholesterol: 111 mg/dL (ref 0–200)
HDL: 37.4 mg/dL — ABNORMAL LOW (ref >39.00)
LDL Cholesterol: 46 mg/dL (ref 0–99)
NonHDL: 73.34
Total CHOL/HDL Ratio: 3
Triglycerides: 139 mg/dL (ref 0.0–149.0)
VLDL: 27.8 mg/dL (ref 0.0–40.0)

## 2022-09-27 LAB — HEMOGLOBIN A1C: Hgb A1c MFr Bld: 5.4 % (ref 4.6–6.5)

## 2022-09-27 MED ORDER — ROSUVASTATIN CALCIUM 10 MG PO TABS: 10.0000 mg | ORAL_TABLET | Freq: Every day | ORAL | 3 refills | Status: DC

## 2022-09-27 MED ORDER — LISINOPRIL-HYDROCHLOROTHIAZIDE 20-12.5 MG PO TABS: 1.0000 | ORAL_TABLET | Freq: Every day | ORAL | 3 refills | Status: DC

## 2022-09-27 MED ORDER — PANTOPRAZOLE SODIUM 40 MG PO TBEC: 40.0000 mg | DELAYED_RELEASE_TABLET | Freq: Every day | ORAL | 3 refills | Status: DC

## 2022-09-27 NOTE — Assessment & Plan Note (Signed)
Checking lipid panel and adjust as needed crestor 10 mg daily.

## 2022-09-27 NOTE — Assessment & Plan Note (Signed)
He has been successful to lose about 30 pounds with zepbound and is currently on 7.5 mg weekly. Will increas to 10 mg weekly once back from trip if desired.

## 2022-09-27 NOTE — Progress Notes (Signed)
Subjective:   Patient ID: Robert Kaufman, male    DOB: 02-23-1970, 52 y.o.   MRN: [id number]  HPI The patient is a 52 YO man coming in for follow up several conditions.   Review of Systems  Constitutional: Negative.   HENT: Negative.    Eyes: Negative.   Respiratory:  Negative for cough, chest tightness and shortness of breath.   Cardiovascular:  Negative for chest pain, palpitations and leg swelling.  Gastrointestinal:  Negative for abdominal distention, abdominal pain, constipation, diarrhea, nausea and vomiting.  Musculoskeletal: Negative.   Skin: Negative.   Neurological: Negative.   Psychiatric/Behavioral: Negative.      Objective:  Physical Exam Constitutional:      Appearance: He is well-developed.  HENT:     Head: Normocephalic and atraumatic.  Cardiovascular:     Rate and Rhythm: Normal rate and regular rhythm.  Pulmonary:     Effort: Pulmonary effort is normal. No respiratory distress.     Breath sounds: Normal breath sounds. No wheezing or rales.  Abdominal:     General: Bowel sounds are normal. There is no distension.     Palpations: Abdomen is soft.     Tenderness: There is no abdominal tenderness. There is no rebound.  Musculoskeletal:     Cervical back: Normal range of motion.  Skin:    General: Skin is warm and dry.  Neurological:     Mental Status: He is alert and oriented to person, place, and time.     Coordination: Coordination normal.     Vitals:   09/27/22 0823  BP: 118/64  Pulse: 75  Temp: 98.2 F (36.8 C)  TempSrc: Oral  SpO2: 95%  Weight: 300 lb (136.1 kg)  Height: 6\' 2"  (1.88 m)    Assessment & Plan:  Flu shot given at visit

## 2022-09-27 NOTE — Assessment & Plan Note (Signed)
BP running lower with weight loss and since he is continuing weight loss will reduce dosing to lisinopril/hydrochlorothiazide 20/12.5 mg daily. New rx done. Checking CMP.

## 2022-09-27 NOTE — Assessment & Plan Note (Signed)
Checking HGa1c and he has lost about 30 pounds since last visit with zepbound.

## 2022-12-03 ENCOUNTER — Other Ambulatory Visit: Payer: Self-pay | Admitting: Internal Medicine

## 2022-12-07 ENCOUNTER — Encounter: Payer: Self-pay | Admitting: Internal Medicine

## 2022-12-07 NOTE — Telephone Encounter (Signed)
It was sent in on 12/2

## 2022-12-08 NOTE — Telephone Encounter (Signed)
Patient called and said his pharmacy needs a diagnosis code for him to use his prescription assistance since he is paying out of pocket. Patient would like to know if it can be sent to the pharmacy or if it can be provided to him. Best callback is [phone].

## 2022-12-12 MED ORDER — ZEPBOUND 7.5 MG/0.5ML ~~LOC~~ SOAJ: 7.5000 mg | SUBCUTANEOUS | 2 refills | Status: DC

## 2022-12-12 NOTE — Addendum Note (Signed)
Addended by: Levonne Lapping on: 12/12/2022 10:36 AM   Modules accepted: Orders

## 2022-12-12 NOTE — Telephone Encounter (Signed)
I have added dx code

## 2022-12-19 ENCOUNTER — Encounter: Payer: Self-pay | Admitting: Internal Medicine

## 2022-12-19 ENCOUNTER — Ambulatory Visit: Payer: 59 | Admitting: Internal Medicine

## 2022-12-19 VITALS — BP 124/80 | HR 93 | Temp 98.4°F | Ht 74.0 in | Wt 294.0 lb

## 2022-12-19 DIAGNOSIS — R7303 Prediabetes: Secondary | ICD-10-CM

## 2022-12-19 DIAGNOSIS — I10 Essential (primary) hypertension: Secondary | ICD-10-CM

## 2022-12-19 DIAGNOSIS — E782 Mixed hyperlipidemia: Secondary | ICD-10-CM | POA: Diagnosis not present

## 2022-12-19 DIAGNOSIS — Z23 Encounter for immunization: Secondary | ICD-10-CM | POA: Diagnosis not present

## 2022-12-19 DIAGNOSIS — Z0000 Encounter for general adult medical examination without abnormal findings: Secondary | ICD-10-CM | POA: Diagnosis not present

## 2022-12-19 NOTE — Progress Notes (Unsigned)
   Subjective:   Patient ID: Robert Kaufman, male    DOB: 1970-04-13, 52 y.o.   MRN: [id number]  HPI The patient is here for physical.  PMH, [facility], social history reviewed and updated  Review of Systems  Objective:  Physical Exam  Vitals:   12/19/22 0930  BP: 124/80  Pulse: 93  Temp: 98.4 F (36.9 C)  TempSrc: Oral  SpO2: 98%  Weight: 294 lb (133.4 kg)  Height: 6\' 2"  (1.88 m)    Assessment & Plan:

## 2022-12-21 DIAGNOSIS — Z0000 Encounter for general adult medical examination without abnormal findings: Secondary | ICD-10-CM | POA: Insufficient documentation

## 2022-12-21 MED ORDER — PANTOPRAZOLE SODIUM 40 MG PO TBEC: 40.0000 mg | DELAYED_RELEASE_TABLET | Freq: Every day | ORAL | 3 refills | Status: AC

## 2022-12-21 MED ORDER — ROSUVASTATIN CALCIUM 10 MG PO TABS: 10.0000 mg | ORAL_TABLET | Freq: Every day | ORAL | 3 refills | Status: AC

## 2022-12-21 MED ORDER — ZEPBOUND 7.5 MG/0.5ML ~~LOC~~ SOAJ: 7.5000 mg | SUBCUTANEOUS | 2 refills | Status: DC

## 2022-12-21 MED ORDER — LISINOPRIL-HYDROCHLOROTHIAZIDE 20-12.5 MG PO TABS: 1.0000 | ORAL_TABLET | Freq: Every day | ORAL | 3 refills | Status: AC

## 2022-12-21 NOTE — Assessment & Plan Note (Signed)
Flu shot up to date. Shingrix complete. Tetanus likely up to date. Colonoscopy up to date. Counseled about sun safety and mole surveillance. Counseled about the dangers of distracted driving. Given 10 year screening recommendations.

## 2022-12-21 NOTE — Assessment & Plan Note (Signed)
Reviewed recent labs.  

## 2022-12-21 NOTE — Assessment & Plan Note (Signed)
Reviewed labs with him and keep crestor 10 mg daily.

## 2022-12-21 NOTE — Assessment & Plan Note (Signed)
Refill zepbound 7.5 mg weekly which is helping with weight loss and no significant side effects. He will let us know if/when he wants to increase dose.

## 2022-12-21 NOTE — Assessment & Plan Note (Signed)
BP at goal on lisinopril/hydrochlorothiazide 20/12.5 mg daily. Reviewed labs with him.

## 2023-01-31 ENCOUNTER — Ambulatory Visit (INDEPENDENT_AMBULATORY_CARE_PROVIDER_SITE_OTHER): Payer: 59 | Admitting: Nurse Practitioner

## 2023-01-31 VITALS — BP 112/86 | HR 75 | Temp 97.6°F | Ht 74.0 in | Wt 295.0 lb

## 2023-01-31 DIAGNOSIS — R079 Chest pain, unspecified: Secondary | ICD-10-CM

## 2023-01-31 LAB — COMPREHENSIVE METABOLIC PANEL
ALT: 29 U/L (ref 0–53)
AST: 22 U/L (ref 0–37)
Albumin: 4.6 g/dL (ref 3.5–5.2)
Alkaline Phosphatase: 63 U/L (ref 39–117)
BUN: 13 mg/dL (ref 6–23)
CO2: 29 meq/L (ref 19–32)
Calcium: 9.9 mg/dL (ref 8.4–10.5)
Chloride: 99 meq/L (ref 96–112)
Creatinine, Ser: 1.07 mg/dL (ref 0.40–1.50)
GFR: 79.57 mL/min (ref >60.00)
Glucose, Bld: 102 mg/dL — ABNORMAL HIGH (ref 70–99)
Potassium: 4 meq/L (ref 3.5–5.1)
Sodium: 136 meq/L (ref 135–145)
Total Bilirubin: 0.7 mg/dL (ref 0.2–1.2)
Total Protein: 7.5 g/dL (ref 6.0–8.3)

## 2023-01-31 LAB — AMYLASE: Amylase: 20 U/L — ABNORMAL LOW (ref 27–131)

## 2023-01-31 LAB — LIPASE: Lipase: 25 U/L (ref 11.0–59.0)

## 2023-01-31 NOTE — Assessment & Plan Note (Signed)
Acute Etiology unclear.  Differentials include CAD, GERD, pancreatitis, gallstone. EKG: Normal sinus rhythm with ST wave inversion in lead V1 and V2.  This was compared to EKG from 2023 which did identify ST wave inversion in V1 but not in V2. I recommend urgent referral to cardiology for further evaluation, I recommend he restart aspirin 81 mg daily and continue on his Crestor.  I recommend that if he has recurrent pain that seems to persist or worsen he just call 911 for evaluation.  He reports his understanding. Will also order labs for further evaluation and will order outpatient ultrasound of the right upper quadrant.  Further recommendations may be made based upon these results.

## 2023-01-31 NOTE — Progress Notes (Signed)
Established Patient Office Visit  Subjective   Patient ID: Robert Kaufman, male    DOB: 22-Sep-1970  Age: 53 y.o. MRN: [id number]  Chief Complaint  Patient presents with   Chest Pain    Chest pain and back of the muscle pain     Patient has today for acute visit for the above. For the last 2 to 3 weeks he has had upper right-middle back pain as well as substernal chest pain.  Pain does come and go but is pretty frequent.  He has significant past family history for CAD including heart attacks and most men in his family under the age of 51.  He did undergo cardiac workup in 2020 including stress test which was low risk and CT cardiac scoring With a score of 154 which was 95th percentile.  He is on rosuvastatin 10 mg daily most recent LDL from about 4 months ago was 46.  He is a never smoker.  He used to be on daily low-dose aspirin but reports he is not taking this currently.  Risk factors for CAD include hypertension, hyperlipidemia, prediabetes, and obesity.  He also has a history of GERD and recently has restarted his pantoprazole.  He is now taking this daily for the last 5 days with use of Pepcid or Tums for breakthrough pain.  He does feel improvement in his symptoms after taking Tums.  He has increased alcohol intake recently to 4-6 drinks per week but does not drink daily.  He is currently on Zepbound and has lost 40 pounds since starting this.    ROS: see HPI    Objective:     BP 112/86   Pulse 75   Temp 97.6 F (36.4 C) (Temporal)   Ht 6\' 2"  (1.88 m)   Wt 295 lb (133.8 kg)   SpO2 98%   BMI 37.88 kg/m    Physical Exam Vitals reviewed.  Constitutional:      Appearance: Normal appearance.  HENT:     Head: Normocephalic and atraumatic.  Cardiovascular:     Rate and Rhythm: Normal rate and regular rhythm.  Pulmonary:     Effort: Pulmonary effort is normal.     Breath sounds: Normal breath sounds.  Musculoskeletal:     Cervical back: Neck supple.  Skin:     General: Skin is warm and dry.  Neurological:     Mental Status: He is alert and oriented to person, place, and time.  Psychiatric:        Mood and Affect: Mood normal.        Behavior: Behavior normal.        Thought Content: Thought content normal.        Judgment: Judgment normal.    EKG: Normal sinus rhythm with ST wave inversion in lead V1 and V2.    No results found for any visits on 01/31/23.    The ASCVD Risk score (Arnett DK, et al., 2019) failed to calculate for the following reasons:   The valid total cholesterol range is 130 to 320 mg/dL    Assessment & Plan:   Problem List Items Addressed This Visit       Other   Chest pain - Primary   Acute Etiology unclear.  Differentials include CAD, GERD, pancreatitis, gallstone. EKG: Normal sinus rhythm with ST wave inversion in lead V1 and V2.  This was compared to EKG from 2023 which did identify ST wave inversion in V1 but not in V2. I recommend  urgent referral to cardiology for further evaluation, I recommend he restart aspirin 81 mg daily and continue on his Crestor.  I recommend that if he has recurrent pain that seems to persist or worsen he just call 911 for evaluation.  He reports his understanding. Will also order labs for further evaluation and will order outpatient ultrasound of the right upper quadrant.  Further recommendations may be made based upon these results.      Relevant Orders   EKG 12-Lead   Comprehensive metabolic panel   Lipase   Amylase   US Abdomen Limited RUQ (LIVER/GB)   Ambulatory referral to Cardiology    Return if symptoms worsen or fail to improve, for F/U with Haidar Muse.  In addition to time spent on obtaining EKG there was 34 minutes spent on encounter today including face-to-face evaluation, review of previous medical records, and development/discussion of treatment plan.   Elenore Paddy, NP

## 2023-02-01 ENCOUNTER — Encounter: Payer: Self-pay | Admitting: Nurse Practitioner

## 2023-02-03 NOTE — Progress Notes (Signed)
 Cardiology Office Note:    Date:  02/06/2023   ID:  Robert Kaufman, DOB March 07, 1970, MRN [id number]  PCP:  Robert Almarie LABOR, MD   [facility] Providers Cardiologist:  None     Referring MD: Robert Lauraine BRAVO, NP   Chief Complaint  Patient presents with   Chest Pain    History of Present Illness:    Robert Kaufman is a 53 y.o. male is seen at the request of Lauraine Elnor NP for evaluation of chest pain. He has a history of HTN, HLD and OSA on CPAP. He has a family history of premature CAD. He was seen remotely by Dr Loni in 2020. He had a coronary calcium  score of 154. ETT was normal.   He reports that 3 weeks ago he began experiencing chest pain. It is mid lower sternal radiating to back. He thought this was heartburn and seemed to get better with antacids but pain kept coming back. He has been on protonix  for a week and still pain wont go away. Symptoms are worse when lying down. He does have a very strong family history of CAD. Notes he has lost 40 lbs on Zepbound .   Past Medical History:  Diagnosis Date   Allergy    Asthma    Hyperlipidemia    Hypertension    Sleep apnea    uses CPAP    Past Surgical History:  Procedure Laterality Date   APPENDECTOMY     COLONOSCOPY  08/2020   3 diminutive hyperplastic polyps   DECOMPRESSIVE LUMBAR LAMINECTOMY LEVEL 1 Right 02/23/2012   Procedure: MICRO-LUMBAR DECOMPRESSION  L4-L5 RIGHT    ;  Surgeon: Robert JAYSON Billing, MD;  Location: [facility];  Service: Orthopedics;  Laterality: Right;  MICRO-LUMBAR DECOMPRESSION  L4-L5 RIGHT       LUMBAR LAMINECTOMY/DECOMPRESSION MICRODISCECTOMY N/A 07/14/2021   Procedure: Revision microlumbar laminectomy and discectomy Lumbar four-five;  Surgeon: Billing Reyes, MD;  Location: [facility];  Service: Orthopedics;  Laterality: N/A;    Current Medications: Current Meds  Medication Sig   aspirin  EC 81 MG tablet Take 1 tablet (81 mg total) by mouth daily. Day after surgery   cetirizine (ZYRTEC) 10 MG  tablet Take 10 mg by mouth in the morning.   fluticasone  (FLONASE ) 50 MCG/ACT nasal spray Place 1 spray into both nostrils in the morning.   Fluticasone -Salmeterol (ADVAIR) 100-50 MCG/DOSE AEPB Inhale 1 puff into the lungs in the morning.   lisinopril -hydrochlorothiazide  (ZESTORETIC ) 20-12.5 MG tablet Take 1 tablet by mouth daily.   metoprolol  tartrate (LOPRESSOR ) 100 MG tablet Take 1 tablet (100 mg total) by mouth once for 1 dose. Take 2 hrs prior to cardiac testing   montelukast  (SINGULAIR ) 10 MG tablet Take 10 mg by mouth in the morning.   pantoprazole  (PROTONIX ) 40 MG tablet Take 1 tablet (40 mg total) by mouth daily.   rosuvastatin  (CRESTOR ) 10 MG tablet Take 1 tablet (10 mg total) by mouth daily.   tirzepatide  (ZEPBOUND ) 7.5 MG/0.5ML Pen Inject 7.5 mg into the skin once a week.     Allergies:   Patient has no known allergies.   Social History   Socioeconomic History   Marital status: Married    Spouse name: Not on file   Number of children: Not on file   Years of education: Not on file   Highest education level: Not on file  Occupational History   Not on file  Tobacco Use   Smoking status: Never   Smokeless tobacco: Never  Vaping Use   Vaping status: Never Used  Substance and Sexual Activity   Alcohol use: Yes    Alcohol/week: 3.0 standard drinks of alcohol    Types: 3 Standard drinks or equivalent per week   Drug use: No   Sexual activity: Yes    Partners: Female    Comment: married  Other Topics Concern   Not on file  Social History Narrative   Not on file   Social Drivers of Health   Financial Resource Strain: Not on file  Food Insecurity: Not on file  Transportation Needs: Not on file  Physical Activity: Not on file  Stress: Not on file  Social Connections: Not on file     Family History: The patient's family history includes Alzheimer's disease in his maternal grandmother; Colon polyps in his sister; Dementia (age of onset: 75) in his maternal  grandmother; Dementia (age of onset: 37) in his paternal grandmother; Diabetes (age of onset: 67) in his sister; Diabetes (age of onset: 86) in his mother; Heart attack (age of onset: 67) in his father; Heart attack (age of onset: 48) in his maternal grandfather; Heart defect (age of onset: 45) in his paternal grandfather; Lung disease (age of onset: 63) in his father; Stroke (age of onset: 10) in his sister. There is no history of Colon cancer, Esophageal cancer, Rectal cancer, or Stomach cancer.  ROS:   Please see the history of present illness.     All other systems reviewed and are negative.  EKGs/Labs/Other Studies Reviewed:    The following studies were reviewed today:  Ecg dated 01/31/23 showed NSR rate 73. Mild T wave inversion V1-3 c/w septal ischemia. I have personally reviewed and interpreted this study.   ETT 02/13/18: Study Highlights    Blood pressure demonstrated a normal response to exercise. There was no ST segment deviation noted during stress. No T wave inversion was noted during stress.   Low risk stress test with no evidence of ischemia on ECG.    Calcium  score 02/13/18: Coronary Calcium  Score   TECHNIQUE: The patient was scanned on a [employer]. Axial non-contrast 3 mm slices were carried out through the heart. The data set was analyzed on a dedicated work station and scored using the Agatson method.   FINDINGS: Non-cardiac: See separate report from [facility].   Ascending Aorta: Not captured in field of view.   Pericardium: Normal   Coronary arteries:   Coronary calcium  score of 154. This was 95th percentile for age and sex matched control.   IMPRESSION: Coronary calcium  score of 154. This was 95th percentile for age and sex matched control. This is greater than expected for age and sex matched peers.     Electronically Signed   By: Soyla Merck   On: 02/14/2018 08:24      Recent Labs: 09/27/2022: Hemoglobin 14.4;  Platelets 174.0 01/31/2023: ALT 29; BUN 13; Creatinine, Ser 1.07; Potassium 4.0; Sodium 136  Recent Lipid Panel    Component Value Date/Time   CHOL 111 09/27/2022 0857   TRIG 139.0 09/27/2022 0857   HDL 37.40 (L) 09/27/2022 0857   CHOLHDL 3 09/27/2022 0857   VLDL 27.8 09/27/2022 0857   LDLCALC 46 09/27/2022 0857     Risk Assessment/Calculations:                Physical Exam:    VS:  BP 112/77   Pulse 80   Ht 6' 2 (1.88 m)   Wt 293 lb (132.9  kg)   SpO2 98%   BMI 37.62 kg/m     Wt Readings from Last 3 Encounters:  02/06/23 293 lb (132.9 kg)  01/31/23 295 lb (133.8 kg)  12/19/22 294 lb (133.4 kg)     GEN:  Well nourished, well developed in no acute distress HEENT: Normal NECK: No JVD; No carotid bruits LYMPHATICS: No lymphadenopathy CARDIAC: RRR, no murmurs, rubs, gallops RESPIRATORY:  Clear to auscultation without rales, wheezing or rhonchi  ABDOMEN: Soft, non-tender, non-distended MUSCULOSKELETAL:  No edema; No deformity  SKIN: Warm and dry NEUROLOGIC:  Alert and oriented x 3 PSYCHIATRIC:  Normal affect   ASSESSMENT:    1. Precordial pain   2. Mixed hyperlipidemia   3. Morbid obesity (HCC)   4. Essential hypertension   5. Chest pain, unspecified type    PLAN:    In order of problems listed above:  Chest pain precordial - some atypical features. Suspect GERD but symptoms have persisted on treatment. He does have elevated calcium  score. I think he needs repeat ischemic evaluation. Will arrange for coronary CTA. He also scheduled for abd US  to rule out gallstones.  HLD excellent control on statin Prediabetes - excellent response to Zepbound . A1c 5.4% HTN well controlled.            Medication Adjustments/Labs and Tests Ordered: Current medicines are reviewed at length with the patient today.  Concerns regarding medicines are outlined above.  Orders Placed This Encounter  Procedures   CT CORONARY MORPH W/CTA COR W/SCORE W/CA W/CM &/OR WO/CM    Meds ordered this encounter  Medications   metoprolol  tartrate (LOPRESSOR ) 100 MG tablet    Sig: Take 1 tablet (100 mg total) by mouth once for 1 dose. Take 2 hrs prior to cardiac testing    Dispense:  1 tablet    Refill:  0    Patient Instructions  Medication Instructions:  Continue same medications *If you need a refill on your cardiac medications before your next appointment, please call your pharmacy*   Lab Work: None    Testing/Procedures:   Your cardiac CT will be scheduled at one of the below locations:   [facility] [address] [place], [place] [address] [phone]  If scheduled at [facility], please arrive at the [facility] and Children's Entrance (Entrance C2) of [facility] 30 minutes prior to test start time. You can use the FREE valet parking offered at entrance C (encouraged to control the heart rate for the test)  Proceed to the [facility] Radiology Department (first floor) to check-in and test prep.  All radiology patients and guests should use entrance C2 at [facility], accessed from [facility], even though the hospital's physical address listed is [address].     Please follow these instructions carefully (unless otherwise directed):  An IV will be required for this test and Nitroglycerin  will be given.  Hold all erectile dysfunction medications at least 3 days (72 hrs) prior to test. (Ie viagra, cialis, sildenafil, tadalafil, etc)   On the Night Before the Test: Be sure to Drink plenty of water. Do not consume any caffeinated/decaffeinated beverages or chocolate 12 hours prior to your test. Do not take any antihistamines 12 hours prior to your test. - cetirizine (ZYRTEC) 10 MG tablet  - fluticasone  (FLONASE ) 50 MCG/ACT nasal spray  - Fluticasone -Salmeterol (ADVAIR) 100-50 MCG/DOSE AEPB  -montelukast  (SINGULAIR ) 10 MG tablet   On the Day of the Test: Drink  plenty of water  until 1 hour prior to the test. Do not eat any food 1 hour prior to test. You may take your regular medications prior to the test.  Take metoprolol  (Lopressor ) 100mg  two hours prior to test. If you take Furosemide/Hydrochlorothiazide /Spironolactone/Chlorthalidone, please HOLD on the morning of the test. HOLD lisinopril -hydrochlorothiazide  (ZESTORETIC )  Patients who wear a continuous glucose monitor MUST remove the device prior to scanning.   After the Test: Drink plenty of water. After receiving IV contrast, you may experience a mild flushed feeling. This is normal. On occasion, you may experience a mild rash up to 24 hours after the test. This is not dangerous. If this occurs, you can take Benadryl 25 mg, Zyrtec, Claritin , or Allegra and increase your fluid intake. (Patients taking Tikosyn should avoid Benadryl, and may take Zyrtec, Claritin , or Allegra) If you experience trouble breathing, this can be serious. If it is severe call 911 IMMEDIATELY. If it is mild, please call our office.  We will call to schedule your test 2-4 weeks out understanding that some insurance companies will need an authorization prior to the service being performed.   For more information and frequently asked questions, please visit our website : [email or web address]  For non-scheduling related questions, please contact the cardiac imaging nurse navigator should you have any questions/concerns: Cardiac Imaging Nurse Navigators Direct Office Dial: [phone]   For scheduling needs, including cancellations and rescheduling, please call Brittany, [phone].  Follow-Up: At [facility], you and your health needs are our priority.  As part of our continuing mission to provide you with exceptional heart care, we have created designated Provider Care Teams.  These Care Teams include your primary Cardiologist (physician) and Advanced Practice Providers (APPs -  Physician Assistants and Nurse  Practitioners) who all work together to provide you with the care you need, when you need it.  We recommend signing up for the patient portal called MyChart.  Sign up information is provided on this After Visit Summary.  MyChart is used to connect with patients for Virtual Visits (Telemedicine).  Patients are able to view lab/test results, encounter notes, upcoming appointments, etc.  Non-urgent messages can be sent to your provider as well.   To learn more about what you can do with MyChart, go to [email or web address].    Your next appointment:  5 weeks:     Provider:  Dr.Hibah Odonnell          Signed, Rontrell Moquin, MD  02/06/2023 5:11 PM    [facility]

## 2023-02-06 ENCOUNTER — Ambulatory Visit: Payer: 59 | Attending: Cardiology | Admitting: Cardiology

## 2023-02-06 ENCOUNTER — Encounter: Payer: Self-pay | Admitting: Cardiology

## 2023-02-06 VITALS — BP 112/77 | HR 80 | Ht 74.0 in | Wt 293.0 lb

## 2023-02-06 DIAGNOSIS — R072 Precordial pain: Secondary | ICD-10-CM

## 2023-02-06 DIAGNOSIS — I10 Essential (primary) hypertension: Secondary | ICD-10-CM

## 2023-02-06 DIAGNOSIS — E782 Mixed hyperlipidemia: Secondary | ICD-10-CM

## 2023-02-06 DIAGNOSIS — R079 Chest pain, unspecified: Secondary | ICD-10-CM

## 2023-02-06 MED ORDER — METOPROLOL TARTRATE 100 MG PO TABS: 100.0000 mg | ORAL_TABLET | Freq: Once | ORAL | 0 refills | Status: DC

## 2023-02-06 NOTE — Patient Instructions (Addendum)
 Medication Instructions:  Continue same medications *If you need a refill on your cardiac medications before your next appointment, please call your pharmacy*   Lab Work: None    Testing/Procedures:   Your cardiac CT will be scheduled at one of the below locations:   [facility] [address] [place], [place] [address] [phone]  If scheduled at [facility], please arrive at the [facility] and Children's Entrance (Entrance C2) of [facility] 30 minutes prior to test start time. You can use the FREE valet parking offered at entrance C (encouraged to control the heart rate for the test)  Proceed to the [facility] Radiology Department (first floor) to check-in and test prep.  All radiology patients and guests should use entrance C2 at [facility], accessed from [facility], even though the hospital's physical address listed is [address]     Please follow these instructions carefully (unless otherwise directed):  An IV will be required for this test and Nitroglycerin  will be given.  Hold all erectile dysfunction medications at least 3 days (72 hrs) prior to test. (Ie viagra, cialis, sildenafil, tadalafil, etc)   On the Night Before the Test: Be sure to Drink plenty of water. Do not consume any caffeinated/decaffeinated beverages or chocolate 12 hours prior to your test. Do not take any antihistamines 12 hours prior to your test. - cetirizine (ZYRTEC) 10 MG tablet  - fluticasone  (FLONASE ) 50 MCG/ACT nasal spray  - Fluticasone -Salmeterol (ADVAIR) 100-50 MCG/DOSE AEPB  -montelukast  (SINGULAIR ) 10 MG tablet   On the Day of the Test: Drink plenty of water until 1 hour prior to the test. Do not eat any food 1 hour prior to test. You may take your regular medications prior to the test.  Take metoprolol  (Lopressor ) 100mg  two hours prior to test. If you take  Furosemide/Hydrochlorothiazide /Spironolactone/Chlorthalidone, please HOLD on the morning of the test. HOLD lisinopril -hydrochlorothiazide  (ZESTORETIC )  Patients who wear a continuous glucose monitor MUST remove the device prior to scanning.   After the Test: Drink plenty of water. After receiving IV contrast, you may experience a mild flushed feeling. This is normal. On occasion, you may experience a mild rash up to 24 hours after the test. This is not dangerous. If this occurs, you can take Benadryl 25 mg, Zyrtec, Claritin , or Allegra and increase your fluid intake. (Patients taking Tikosyn should avoid Benadryl, and may take Zyrtec, Claritin , or Allegra) If you experience trouble breathing, this can be serious. If it is severe call 911 IMMEDIATELY. If it is mild, please call our office.  We will call to schedule your test 2-4 weeks out understanding that some insurance companies will need an authorization prior to the service being performed.   For more information and frequently asked questions, please visit our website : [email or web address]  For non-scheduling related questions, please contact the cardiac imaging nurse navigator should you have any questions/concerns: Cardiac Imaging Nurse Navigators Direct Office Dial: [phone]   For scheduling needs, including cancellations and rescheduling, please call Brittany, [phone].  Follow-Up: At [facility], you and your health needs are our priority.  As part of our continuing mission to provide you with exceptional heart care, we have created designated Provider Care Teams.  These Care Teams include your primary Cardiologist (physician) and Advanced Practice Providers (APPs -  Physician Assistants and Nurse Practitioners) who all work together to provide you with the care you need, when you need it.  We recommend signing  up for the patient portal called MyChart.  Sign up information is provided on this After  Visit Summary.  MyChart is used to connect with patients for Virtual Visits (Telemedicine).  Patients are able to view lab/test results, encounter notes, upcoming appointments, etc.  Non-urgent messages can be sent to your provider as well.   To learn more about what you can do with MyChart, go to [email or web address].    Your next appointment:  5 weeks:     Provider:  Dr.Jordan

## 2023-02-08 ENCOUNTER — Ambulatory Visit
Admission: RE | Admit: 2023-02-08 | Discharge: 2023-02-08 | Disposition: A | Discharge status: Home / Self Care | Payer: 59 | Source: Ambulatory Visit | Attending: Nurse Practitioner | Admitting: Nurse Practitioner

## 2023-02-08 DIAGNOSIS — R079 Chest pain, unspecified: Secondary | ICD-10-CM

## 2023-02-09 ENCOUNTER — Encounter: Payer: Self-pay | Admitting: Nurse Practitioner

## 2023-02-21 ENCOUNTER — Encounter (HOSPITAL_COMMUNITY): Payer: Self-pay

## 2023-02-23 ENCOUNTER — Encounter (HOSPITAL_COMMUNITY): Payer: Self-pay

## 2023-02-23 ENCOUNTER — Ambulatory Visit (HOSPITAL_COMMUNITY)
Admission: RE | Admit: 2023-02-23 | Discharge: 2023-02-23 | Disposition: A | Discharge status: Home / Self Care | Payer: 59 | Source: Ambulatory Visit | Attending: Cardiology | Admitting: Cardiology

## 2023-02-23 DIAGNOSIS — R079 Chest pain, unspecified: Secondary | ICD-10-CM

## 2023-03-06 ENCOUNTER — Other Ambulatory Visit (HOSPITAL_COMMUNITY): Payer: Self-pay | Admitting: *Deleted

## 2023-03-06 ENCOUNTER — Encounter: Payer: Self-pay | Admitting: Cardiology

## 2023-03-06 ENCOUNTER — Other Ambulatory Visit: Payer: Self-pay | Admitting: Cardiology

## 2023-03-06 MED ORDER — METOPROLOL TARTRATE 100 MG PO TABS: 100.0000 mg | ORAL_TABLET | Freq: Once | ORAL | 0 refills | Status: DC

## 2023-03-08 ENCOUNTER — Telehealth (HOSPITAL_COMMUNITY): Payer: Self-pay | Admitting: *Deleted

## 2023-03-08 NOTE — Telephone Encounter (Signed)
 Attempted to call patient regarding upcoming cardiac CT appointment. Left message on voicemail with name and callback number Johney Frame RN Navigator Cardiac Imaging [facility] [phone] Office

## 2023-03-09 ENCOUNTER — Ambulatory Visit (HOSPITAL_COMMUNITY)
Admission: RE | Admit: 2023-03-09 | Discharge: 2023-03-09 | Disposition: A | Discharge status: Home / Self Care | Payer: 59 | Source: Ambulatory Visit | Attending: Cardiology | Admitting: Cardiology

## 2023-03-09 DIAGNOSIS — R079 Chest pain, unspecified: Secondary | ICD-10-CM | POA: Insufficient documentation

## 2023-03-09 DIAGNOSIS — I2510 Atherosclerotic heart disease of native coronary artery without angina pectoris: Secondary | ICD-10-CM | POA: Diagnosis not present

## 2023-03-09 MED ORDER — DILTIAZEM HCL 25 MG/5ML IV SOLN: 10.0000 mg | INTRAVENOUS | Status: DC | PRN

## 2023-03-09 MED ORDER — NITROGLYCERIN 0.4 MG SL SUBL
SUBLINGUAL_TABLET | SUBLINGUAL | Status: AC
  Filled 2023-03-09: qty 2

## 2023-03-09 MED ORDER — METOPROLOL TARTRATE 5 MG/5ML IV SOLN
INTRAVENOUS | Status: AC
  Filled 2023-03-09: qty 10

## 2023-03-09 MED ORDER — IOHEXOL 350 MG/ML SOLN
100.0000 mL | Freq: Once | INTRAVENOUS | Status: AC | PRN
  Administered 2023-03-09: 100 mL via INTRAVENOUS

## 2023-03-09 MED ORDER — NITROGLYCERIN 0.4 MG SL SUBL
0.8000 mg | SUBLINGUAL_TABLET | Freq: Once | SUBLINGUAL | Status: AC
  Administered 2023-03-09: 0.8 mg via SUBLINGUAL

## 2023-03-09 MED ORDER — METOPROLOL TARTRATE 5 MG/5ML IV SOLN: 10.0000 mg | Freq: Once | INTRAVENOUS | Status: DC | PRN

## 2023-03-30 NOTE — Progress Notes (Signed)
 Cardiology Office Note:    Date:  04/03/2023   ID:  Robert Kaufman, DOB 1970/05/05, MRN [id number]  PCP:  Myrlene Broker, MD   [facility] Providers Cardiologist:  None     Referring MD: Myrlene Broker, *   Chief Complaint  Patient presents with   Follow-up   Coronary Artery Disease    History of Present Illness:    Robert Kaufman is a 53 y.o. male is seen for follow up  of chest pain. He has a history of HTN, HLD and OSA on CPAP. He has a family history of premature CAD. He was seen remotely by Dr Jacques Navy in 2020. He had a coronary calcium score of 154. ETT was normal.   He was seen in Feb with chest pain.  It is mid lower sternal radiating to back. He thought this was heartburn and seemed to get better with antacids but pain kept coming back. Symptoms are worse when lying down. He does have a very strong family history of CAD. Notes he has lost 40 lbs on Zepbound.   We did perform coronary CTA which showed nonobstructive CAD but high total plaque volume and coronary calcium score of 432.  On follow up today he notes that his symptoms have resolved with regular Protonix use. His Abd US showed no gallstones. He did have steatosis. Notes BP at home 120/60-70.   Past Medical History:  Diagnosis Date   Allergy    Asthma    Hyperlipidemia    Hypertension    Sleep apnea    uses CPAP    Past Surgical History:  Procedure Laterality Date   APPENDECTOMY     COLONOSCOPY  08/2020   3 diminutive hyperplastic polyps   DECOMPRESSIVE LUMBAR LAMINECTOMY LEVEL 1 Right 02/23/2012   Procedure: MICRO-LUMBAR DECOMPRESSION  L4-L5 RIGHT    ;  Surgeon: Javier Docker, MD;  Location: [facility];  Service: Orthopedics;  Laterality: Right;  MICRO-LUMBAR DECOMPRESSION  L4-L5 RIGHT       LUMBAR LAMINECTOMY/DECOMPRESSION MICRODISCECTOMY N/A 07/14/2021   Procedure: Revision microlumbar laminectomy and discectomy Lumbar four-five;  Surgeon: Jene Every, MD;  Location: [facility] OR;   Service: Orthopedics;  Laterality: N/A;    Current Medications: Current Meds  Medication Sig   aspirin EC 81 MG tablet Take 1 tablet (81 mg total) by mouth daily. Day after surgery   cetirizine (ZYRTEC) 10 MG tablet Take 10 mg by mouth in the morning.   fluticasone (FLONASE) 50 MCG/ACT nasal spray Place 1 spray into both nostrils in the morning.   Fluticasone-Salmeterol (ADVAIR) 100-50 MCG/DOSE AEPB Inhale 1 puff into the lungs in the morning.   lisinopril-hydrochlorothiazide (ZESTORETIC) 20-12.5 MG tablet Take 1 tablet by mouth daily.   montelukast (SINGULAIR) 10 MG tablet Take 10 mg by mouth in the morning.   pantoprazole (PROTONIX) 40 MG tablet Take 1 tablet (40 mg total) by mouth daily.   rosuvastatin (CRESTOR) 10 MG tablet Take 1 tablet (10 mg total) by mouth daily.   tirzepatide (ZEPBOUND) 7.5 MG/0.5ML Pen Inject 7.5 mg into the skin once a week.     Allergies:   Patient has no known allergies.   Social History   Socioeconomic History   Marital status: Married    Spouse name: Not on file   Number of children: Not on file   Years of education: Not on file   Highest education level: Not on file  Occupational History   Not on file  Tobacco Use  Smoking status: Never   Smokeless tobacco: Never  Vaping Use   Vaping status: Never Used  Substance and Sexual Activity   Alcohol use: Yes    Alcohol/week: 3.0 standard drinks of alcohol    Types: 3 Standard drinks or equivalent per week    Comment: occasionally   Drug use: No   Sexual activity: Yes    Partners: Female    Comment: married  Other Topics Concern   Not on file  Social History Narrative   Not on file   Social Drivers of Health   Financial Resource Strain: Not on file  Food Insecurity: Not on file  Transportation Needs: Not on file  Physical Activity: Not on file  Stress: Not on file  Social Connections: Not on file     Family History: The patient's family history includes Alzheimer's disease in his  maternal grandmother; Colon polyps in his sister; Dementia (age of onset: 64) in his maternal grandmother; Dementia (age of onset: 30) in his paternal grandmother; Diabetes (age of onset: 7) in his sister; Diabetes (age of onset: 49) in his mother; Heart attack (age of onset: 91) in his father; Heart attack (age of onset: 56) in his maternal grandfather; Heart defect (age of onset: 20) in his paternal grandfather; Lung disease (age of onset: 68) in his father; Stroke (age of onset: 49) in his sister. There is no history of Colon cancer, Esophageal cancer, Rectal cancer, or Stomach cancer.  ROS:   Please see the history of present illness.     All other systems reviewed and are negative.  EKGs/Labs/Other Studies Reviewed:    The following studies were reviewed today:   ETT 02/13/18: Study Highlights    Blood pressure demonstrated a normal response to exercise. There was no ST segment deviation noted during stress. No T wave inversion was noted during stress.   Low risk stress test with no evidence of ischemia on ECG.    Calcium score 02/13/18: Coronary Calcium Score   TECHNIQUE: The patient was scanned on a [employer]. Axial non-contrast 3 mm slices were carried out through the heart. The data set was analyzed on a dedicated work station and scored using the Agatson method.   FINDINGS: Non-cardiac: See separate report from [facility].   Ascending Aorta: Not captured in field of view.   Pericardium: Normal   Coronary arteries:   Coronary calcium score of 154. This was 95th percentile for age and sex matched control.   IMPRESSION: Coronary calcium score of 154. This was 95th percentile for age and sex matched control. This is greater than expected for age and sex matched peers.     Electronically Signed   By: Weston Brass   On: 02/14/2018 08:24     IMPRESSION: 2 mm right middle lobe pulmonary nodule. No follow-up needed if patient is  low-risk.This recommendation follows the consensus statement: Guidelines for Management of Incidental Pulmonary Nodules Detected on CT Images: From the Fleischner Society 2017; Radiology 2017; 284:228-243.     Electronically Signed   By: Aram Candela M.D.   On: 03/27/2023 21:37    Addended by Theophilus Bones, MD on 03/27/2023  9:39 PM    Study Result  Narrative & Impression  CLINICAL DATA:  Chest pain   EXAM: Cardiac/Coronary CTA   TECHNIQUE: A non-contrast, gated CT scan was obtained with axial slices of 3 mm through the heart for calcium scoring. Calcium scoring was performed using the Agatston method. A 120 kV  prospective, gated, contrast cardiac scan was obtained. Gantry rotation speed was 250 msecs and collimation was 0.6 mm. Two sublingual nitroglycerin tablets (0.8 mg) were given. The 3D data set was reconstructed in 5% intervals of the 35-75% of the R-R cycle. Diastolic phases were analyzed on a dedicated workstation using MPR, MIP, and VRT modes. The patient received 95 cc of contrast.   FINDINGS: Image quality: Excellent.   Noise artifact is: Limited.   Coronary Arteries:  Normal coronary origin.  Right dominance.   Left main: The left main is a large caliber vessel with a normal take off from the left coronary cusp that bifurcates to form a left anterior descending artery and a left circumflex artery. There is no plaque or stenosis.   Left anterior descending artery: The LAD contains minimal mixed density plaque (<25%) in all segments. D1 contains minimal mixed density plaque (<25%). D2 is small and patent.   Left circumflex artery: The LCX is non-dominant. There is mild mixed density plaque in the mid LCX (25-49%). OM1 contains minimal calcified plaque (<25%). OM2/3 are patent.   Right coronary artery: The RCA is dominant with normal take off from the right coronary cusp. There is minimal mixed density plaque (<25%) in all segments. The RCA  terminates as a PDA and right posterolateral branch without evidence of plaque or stenosis.   Right Atrium: Right atrial size is within normal limits.   Right Ventricle: The right ventricular cavity is within normal limits.   Left Atrium: Left atrial size is normal in size with no left atrial appendage filling defect. Small PFO.   Left Ventricle: The ventricular cavity size is within normal limits.   Pulmonary arteries: Normal in size.   Pulmonary veins: Normal pulmonary venous drainage.   Pericardium: Normal thickness without significant effusion or calcium present.   Cardiac valves: The aortic valve is trileaflet without significant calcification. The mitral valve is normal without significant calcification.   Aorta: Normal caliber without significant disease.   Extra-cardiac findings: See attached radiology report for non-cardiac structures.   IMPRESSION: 1. Coronary calcium score of 432. This was 96th percentile for age-, sex, and race-matched controls.   2. Total plaque volume 776 mm3 which is 89th percentile for age- and sex-matched controls (calcified plaque 151 mm3; non-calcified plaque 625 mm3). TPV is extensive.   3. Normal coronary origin with right dominance.   4. Mild mixed density plaque (25-49%) in the mid LCX.   5. Minimal mixed density plaque in the LAD (<25%).   6. Minimal mixed density plaque in the RCA (<25%).   RECOMMENDATIONS: 1. CAD-RADS 2: Mild non-obstructive CAD (25-49%). Consider non-atherosclerotic causes of chest pain. Consider preventive therapy and risk factor modification.   Lennie Odor, MD      Recent Labs: 09/27/2022: Hemoglobin 14.4; Platelets 174.0 01/31/2023: ALT 29; BUN 13; Creatinine, Ser 1.07; Potassium 4.0; Sodium 136  Recent Lipid Panel    Component Value Date/Time   CHOL 111 09/27/2022 0857   TRIG 139.0 09/27/2022 0857   HDL 37.40 (L) 09/27/2022 0857   CHOLHDL 3 09/27/2022 0857   VLDL 27.8 09/27/2022 0857    LDLCALC 46 09/27/2022 0857     Risk Assessment/Calculations:       Physical Exam:    VS:  BP 136/86 (BP Location: Left Arm, Patient Position: Sitting, Cuff Size: Large)   Pulse 75   Resp 16   Ht 6\' 2"  (1.88 m)   Wt 290 lb 3.2 oz (131.6 kg)   SpO2 97%  BMI 37.26 kg/m     Wt Readings from Last 3 Encounters:  04/03/23 290 lb 3.2 oz (131.6 kg)  02/06/23 293 lb (132.9 kg)  01/31/23 295 lb (133.8 kg)     GEN:  Well nourished, well developed in no acute distress HEENT: Normal NECK: No JVD; No carotid bruits LYMPHATICS: No lymphadenopathy CARDIAC: RRR, no murmurs, rubs, gallops RESPIRATORY:  Clear to auscultation without rales, wheezing or rhonchi  ABDOMEN: Soft, non-tender, non-distended MUSCULOSKELETAL:  No edema; No deformity  SKIN: Warm and dry NEUROLOGIC:  Alert and oriented x 3 PSYCHIATRIC:  Normal affect   ASSESSMENT:    No diagnosis found.  PLAN:    In order of problems listed above:  Chest pain it turns out that this is GERD. Responded to Protonix. Nonobstructive CAD on CTA.  CAD. Nonobstructive CAD on CTA. Calcium score has increased from 2020 and he does have a high total plaque volume. At this point need to focus on aggressive risk factor modification.  HLD excellent control on statin. LDL 46 which is at goal < 55. Will plan to check lipoprotein (a) with next blood draw. Encourage heart healthy diet and regular aerobic exercise - up to 45 minutes daily.  Prediabetes - excellent response to Zepbound. A1c 5.4%.  HTN well controlled.            Medication Adjustments/Labs and Tests Ordered: Current medicines are reviewed at length with the patient today.  Concerns regarding medicines are outlined above.  No orders of the defined types were placed in this encounter.  No orders of the defined types were placed in this encounter.   There are no Patient Instructions on file for this visit.   Signed, Jayra Choyce [place], MD  04/03/2023 4:25 PM    [facility]

## 2023-04-02 ENCOUNTER — Encounter (HOSPITAL_BASED_OUTPATIENT_CLINIC_OR_DEPARTMENT_OTHER): Payer: Self-pay

## 2023-04-03 ENCOUNTER — Ambulatory Visit: Payer: 59 | Attending: Cardiology | Admitting: Cardiology

## 2023-04-03 ENCOUNTER — Encounter: Payer: Self-pay | Admitting: Cardiology

## 2023-04-03 VITALS — BP 136/86 | HR 75 | Resp 16 | Ht 74.0 in | Wt 290.2 lb

## 2023-04-03 DIAGNOSIS — I25118 Atherosclerotic heart disease of native coronary artery with other forms of angina pectoris: Secondary | ICD-10-CM

## 2023-04-03 DIAGNOSIS — E782 Mixed hyperlipidemia: Secondary | ICD-10-CM

## 2023-04-03 DIAGNOSIS — I10 Essential (primary) hypertension: Secondary | ICD-10-CM

## 2023-04-03 NOTE — Patient Instructions (Signed)
 Medication Instructions:  Continue same medications *If you need a refill on your cardiac medications before your next appointment, please call your pharmacy*  Lab Work: LPa have done with PCP  Testing/Procedures: None ordered  Follow-Up: At [facility], you and your health needs are our priority.  As part of our continuing mission to provide you with exceptional heart care, our providers are all part of one team.  This team includes your primary Cardiologist (physician) and Advanced Practice Providers or APPs (Physician Assistants and Nurse Practitioners) who all work together to provide you with the care you need, when you need it.  Your next appointment:  1 year   Call in Dec to schedule April appointment     Provider:  Dr.Jordan   We recommend signing up for the patient portal called "MyChart".  Sign up information is provided on this After Visit Summary.  MyChart is used to connect with patients for Virtual Visits (Telemedicine).  Patients are able to view lab/test results, encounter notes, upcoming appointments, etc.  Non-urgent messages can be sent to your provider as well.   To learn more about what you can do with MyChart, go to [email or web address].        1st Floor: - Lobby - Registration  - Pharmacy  - Lab - Cafe  2nd Floor: - PV Lab - Diagnostic Testing (echo, CT, nuclear med)  3rd Floor: - Vacant  4th Floor: - TCTS (cardiothoracic surgery) - AFib Clinic - Structural Heart Clinic - Vascular Surgery  - Vascular Ultrasound  5th Floor: - HeartCare Cardiology (general and EP) - Clinical Pharmacy for coumadin, hypertension, lipid, weight-loss medications, and med management appointments    Valet parking services will be available as well.

## 2023-05-29 ENCOUNTER — Other Ambulatory Visit: Payer: Self-pay | Admitting: Internal Medicine

## 2023-05-29 NOTE — Telephone Encounter (Signed)
 Copied from CRM [id number]. Topic: Clinical - Medication Refill >> May 29, 2023  4:52 PM Danna Duster wrote: Medication: ZEPBOUND  7.5 MG/0.5ML Pen  Has the patient contacted their pharmacy? Yes (Agent: If no, request that the patient contact the pharmacy for the refill. If patient does not wish to contact the pharmacy document the reason why and proceed with request.) (Agent: If yes, when and what did the pharmacy advise?)  This is the patient's preferred pharmacy:  [id number] - [place], [place] - [address] AT [facility] OF [address] [place] [address] Phone: [phone] Fax: [phone]  Is this the correct pharmacy for this prescription? Yes If no, delete pharmacy and type the correct one.   Has the prescription been filled recently? Yes  Is the patient out of the medication? Yes  Has the patient been seen for an appointment in the last year OR does the patient have an upcoming appointment? Yes  Can we respond through MyChart? Yes  Agent: Please be advised that Rx refills may take up to 3 business days. We ask that you follow-up with your pharmacy.

## 2023-05-29 NOTE — Telephone Encounter (Signed)
Please review and refill if necessary.

## 2023-07-30 ENCOUNTER — Encounter: Payer: Self-pay | Admitting: Internal Medicine

## 2023-07-31 ENCOUNTER — Other Ambulatory Visit: Payer: Self-pay

## 2023-07-31 MED ORDER — ZEPBOUND 7.5 MG/0.5ML ~~LOC~~ SOAJ: 7.5000 mg | SUBCUTANEOUS | 2 refills | Status: DC

## 2023-07-31 NOTE — Telephone Encounter (Signed)
**Note De-identified  [place] Obfuscation** Please advise 

## 2023-10-12 ENCOUNTER — Telehealth: Payer: Self-pay

## 2023-10-12 ENCOUNTER — Other Ambulatory Visit (HOSPITAL_COMMUNITY): Payer: Self-pay

## 2023-10-12 NOTE — Telephone Encounter (Signed)
 Pharmacy Patient Advocate Encounter   Received notification from Onbase that prior authorization for Zepbound  7.5 is required/requested.   Insurance verification completed.   The patient is insured through [employer].   Per test claim: Per test claim, medication is not covered due to plan/benefit exclusion, PA not submitted at this time

## 2023-11-13 ENCOUNTER — Other Ambulatory Visit: Payer: Self-pay | Admitting: Internal Medicine

## 2023-12-20 ENCOUNTER — Encounter: Payer: 59 | Admitting: Internal Medicine

## 2023-12-21 ENCOUNTER — Encounter: Payer: Self-pay | Admitting: Internal Medicine

## 2023-12-21 ENCOUNTER — Ambulatory Visit: Admitting: Internal Medicine

## 2023-12-21 ENCOUNTER — Ambulatory Visit: Payer: Self-pay | Admitting: Internal Medicine

## 2023-12-21 VITALS — BP 124/80 | HR 76 | Temp 98.0°F | Ht 74.0 in | Wt 276.6 lb

## 2023-12-21 DIAGNOSIS — Z0000 Encounter for general adult medical examination without abnormal findings: Secondary | ICD-10-CM | POA: Diagnosis not present

## 2023-12-21 DIAGNOSIS — E782 Mixed hyperlipidemia: Secondary | ICD-10-CM | POA: Diagnosis not present

## 2023-12-21 DIAGNOSIS — I10 Essential (primary) hypertension: Secondary | ICD-10-CM

## 2023-12-21 DIAGNOSIS — Z23 Encounter for immunization: Secondary | ICD-10-CM | POA: Diagnosis not present

## 2023-12-21 DIAGNOSIS — R7303 Prediabetes: Secondary | ICD-10-CM | POA: Diagnosis not present

## 2023-12-21 DIAGNOSIS — Z1159 Encounter for screening for other viral diseases: Secondary | ICD-10-CM

## 2023-12-21 LAB — COMPREHENSIVE METABOLIC PANEL WITH GFR
ALT: 23 U/L (ref 3–53)
AST: 18 U/L (ref 5–37)
Albumin: 4.7 g/dL (ref 3.5–5.2)
Alkaline Phosphatase: 72 U/L (ref 39–117)
BUN: 14 mg/dL (ref 6–23)
CO2: 29 meq/L (ref 19–32)
Calcium: 10.2 mg/dL (ref 8.4–10.5)
Chloride: 101 meq/L (ref 96–112)
Creatinine, Ser: 1.05 mg/dL (ref 0.40–1.50)
GFR: 80.88 mL/min
Glucose, Bld: 92 mg/dL (ref 70–99)
Potassium: 3.6 meq/L (ref 3.5–5.1)
Sodium: 139 meq/L (ref 135–145)
Total Bilirubin: 0.8 mg/dL (ref 0.2–1.2)
Total Protein: 7.7 g/dL (ref 6.0–8.3)

## 2023-12-21 LAB — CBC
HCT: 44.1 % (ref 39.0–52.0)
Hemoglobin: 15.2 g/dL (ref 13.0–17.0)
MCHC: 34.4 g/dL (ref 30.0–36.0)
MCV: 83.9 fl (ref 78.0–100.0)
Platelets: 151 K/uL (ref 150.0–400.0)
RBC: 5.26 Mil/uL (ref 4.22–5.81)
RDW: 13.3 % (ref 11.5–15.5)
WBC: 7.8 K/uL (ref 4.0–10.5)

## 2023-12-21 LAB — LIPID PANEL
Cholesterol: 120 mg/dL (ref 28–200)
HDL: 41.8 mg/dL
LDL Cholesterol: 57 mg/dL (ref 10–99)
NonHDL: 77.77
Total CHOL/HDL Ratio: 3
Triglycerides: 104 mg/dL (ref 10.0–149.0)
VLDL: 20.8 mg/dL (ref 0.0–40.0)

## 2023-12-21 LAB — HEMOGLOBIN A1C: Hgb A1c MFr Bld: 5.3 % (ref 4.6–6.5)

## 2023-12-21 NOTE — Assessment & Plan Note (Signed)
 BP at goal on meds checking CMP and adjust as needed.

## 2023-12-21 NOTE — Progress Notes (Signed)
" ° °  Subjective:   Patient ID: Robert Kaufman, male    DOB: 1970-01-12, 53 y.o.   MRN: [id number]  The patient is here for physical. Pertinent topics discussed: Discussed the use of AI scribe software for clinical note transcription with the patient, who gave verbal consent to proceed. History of Present Illness Robert Kaufman is a 53 year old male who presents for a follow-up regarding weight management and medication side effects.  He experiences diarrhea as a side effect of his medication, which occurs more frequently around the time he takes his dose. The symptoms are most severe in the first couple of days after taking the medication and then ease off. If he misses a dose and takes it late, the symptoms are more pronounced. He takes his medication every eight days to manage costs, as he pays out of pocket.  He has lost almost sixty pounds overall and is currently on a 7.5 mg dose of his medication. He feels comfortable at his current weight, which is around 272 pounds, and recalls being around 260 pounds when he graduated college. He is considering increasing physical activity to aid further weight loss.  PMH, [facility], social history reviewed and updated  Review of Systems  Constitutional: Negative.   HENT: Negative.    Eyes: Negative.   Respiratory:  Negative for cough, chest tightness and shortness of breath.   Cardiovascular:  Negative for chest pain, palpitations and leg swelling.  Gastrointestinal:  Negative for abdominal distention, abdominal pain, constipation, diarrhea, nausea and vomiting.  Musculoskeletal: Negative.   Skin: Negative.   Neurological: Negative.   Psychiatric/Behavioral: Negative.      Objective:  Physical Exam Constitutional:      Appearance: He is well-developed.  HENT:     Head: Normocephalic and atraumatic.  Cardiovascular:     Rate and Rhythm: Normal rate and regular rhythm.  Pulmonary:     Effort: Pulmonary effort is normal. No respiratory  distress.     Breath sounds: Normal breath sounds. No wheezing or rales.  Abdominal:     General: Bowel sounds are normal. There is no distension.     Palpations: Abdomen is soft.     Tenderness: There is no abdominal tenderness.  Musculoskeletal:     Cervical back: Normal range of motion.  Skin:    General: Skin is warm and dry.  Neurological:     Mental Status: He is alert and oriented to person, place, and time.     Coordination: Coordination normal.     Vitals:   12/21/23 0907  BP: 124/80  Pulse: 76  Temp: 98 F (36.7 C)  TempSrc: Oral  SpO2: 98%  Weight: 276 lb 9.6 oz (125.5 kg)  Height: 6' 2 (1.88 m)    Assessment & Plan:  Flu shot given at visit "

## 2023-12-21 NOTE — Assessment & Plan Note (Signed)
Flu shot given. Pneumonia complete. Shingrix complete. Tetanus up to date. Colonoscopy up to date. Counseled about sun safety and mole surveillance. Counseled about the dangers of distracted driving. Given 10 year screening recommendations.

## 2023-12-21 NOTE — Assessment & Plan Note (Signed)
 Checking lipid panel and adjust regimen as needed.

## 2023-12-21 NOTE — Assessment & Plan Note (Signed)
 Checking HgA1c and adjust as needed. Was lower last year.

## 2023-12-21 NOTE — Assessment & Plan Note (Signed)
 BMI 35 and complicated by hypertension. Working on weight loss with zepbound  and will continue.

## 2023-12-22 LAB — HEPATITIS C ANTIBODY: Hepatitis C Ab: NONREACTIVE

## 2024-01-09 ENCOUNTER — Ambulatory Visit: Admitting: Internal Medicine

## 2024-01-09 ENCOUNTER — Encounter: Payer: Self-pay | Admitting: Internal Medicine

## 2024-01-09 VITALS — BP 100/60 | HR 73 | Temp 98.3°F | Ht 74.0 in | Wt 280.0 lb

## 2024-01-09 DIAGNOSIS — I10 Essential (primary) hypertension: Secondary | ICD-10-CM

## 2024-01-09 DIAGNOSIS — J069 Acute upper respiratory infection, unspecified: Secondary | ICD-10-CM

## 2024-01-09 MED ORDER — AZITHROMYCIN 250 MG PO TABS: ORAL_TABLET | ORAL | 0 refills | Status: AC

## 2024-01-09 NOTE — Progress Notes (Signed)
 "  Subjective:  Patient ID: Nyle Limb, male    DOB: [birth year]  Age: 54 y.o. MRN: [id number]  CC: No chief complaint on file.   HPI Cameran Pettey presents for ST, congestion x 5 d. All family is/was ill... They were in [facility]   Outpatient Medications Prior to Visit  Medication Sig Dispense Refill   aspirin  EC 81 MG tablet Take 1 tablet (81 mg total) by mouth daily. Day after surgery 21 tablet 1   cetirizine (ZYRTEC) 10 MG tablet Take 10 mg by mouth in the morning.     fluticasone  (FLONASE ) 50 MCG/ACT nasal spray Place 1 spray into both nostrils in the morning.     Fluticasone -Salmeterol (ADVAIR) 100-50 MCG/DOSE AEPB Inhale 1 puff into the lungs in the morning.     lisinopril -hydrochlorothiazide  (ZESTORETIC ) 20-12.5 MG tablet Take 1 tablet by mouth daily. 90 tablet 3   montelukast  (SINGULAIR ) 10 MG tablet Take 10 mg by mouth in the morning.     pantoprazole  (PROTONIX ) 40 MG tablet Take 1 tablet (40 mg total) by mouth daily. 90 tablet 3   rosuvastatin  (CRESTOR ) 10 MG tablet Take 1 tablet (10 mg total) by mouth daily. 90 tablet 3   ZEPBOUND  7.5 MG/0.5ML Pen ADMINISTER 7.5 MG UNDER THE SKIN 1 TIME A WEEK 2 mL 2   No facility-administered medications prior to visit.    ROS: Review of Systems  Constitutional:  Negative for appetite change, fatigue and unexpected weight change.  HENT:  Positive for congestion, rhinorrhea, sinus pressure, sore throat and voice change. Negative for nosebleeds, sneezing and trouble swallowing.   Eyes:  Negative for itching and visual disturbance.  Respiratory:  Negative for cough, chest tightness and shortness of breath.   Cardiovascular:  Negative for chest pain, palpitations and leg swelling.  Gastrointestinal:  Negative for abdominal distention, blood in stool, diarrhea, nausea and vomiting.  Genitourinary:  Negative for frequency and hematuria.  Musculoskeletal:  Negative for back pain, gait problem, joint swelling and neck pain.  Skin:  Negative for  rash.  Neurological:  Negative for dizziness, tremors, speech difficulty and weakness.  Psychiatric/Behavioral:  Negative for agitation, dysphoric mood and sleep disturbance. The patient is not nervous/anxious.     Objective:  BP 100/60   Pulse 73   Temp 98.3 F (36.8 C)   Ht 6' 2 (1.88 m)   Wt 280 lb (127 kg)   SpO2 99%   BMI 35.95 kg/m   BP Readings from Last 3 Encounters:  01/09/24 100/60  12/21/23 124/80  04/03/23 136/86    Wt Readings from Last 3 Encounters:  01/09/24 280 lb (127 kg)  12/21/23 276 lb 9.6 oz (125.5 kg)  04/03/23 290 lb 3.2 oz (131.6 kg)    Physical Exam Constitutional:      General: He is not in acute distress.    Appearance: He is well-developed.     Comments: NAD  HENT:     Right Ear: There is no impacted cerumen.     Left Ear: There is no impacted cerumen.     Nose: Congestion present.     Mouth/Throat:     Pharynx: Posterior oropharyngeal erythema present. No oropharyngeal exudate.  Eyes:     Conjunctiva/sclera: Conjunctivae normal.     Pupils: Pupils are equal, round, and reactive to light.  Neck:     Thyroid: No thyromegaly.     Vascular: No JVD.  Cardiovascular:     Rate and Rhythm: Normal rate and regular rhythm.  Heart sounds: Normal heart sounds. No murmur heard.    No friction rub. No gallop.  Pulmonary:     Effort: Pulmonary effort is normal. No respiratory distress.     Breath sounds: Normal breath sounds. No wheezing or rales.  Chest:     Chest wall: No tenderness.  Abdominal:     General: Bowel sounds are normal. There is no distension.     Palpations: Abdomen is soft. There is no mass.     Tenderness: There is no abdominal tenderness. There is no guarding or rebound.  Musculoskeletal:        General: No tenderness. Normal range of motion.     Cervical back: Normal range of motion.  Lymphadenopathy:     Cervical: No cervical adenopathy.  Skin:    General: Skin is warm and dry.     Findings: No rash.   Neurological:     Mental Status: He is alert and oriented to person, place, and time.     Cranial Nerves: No cranial nerve deficit.     Motor: No abnormal muscle tone.     Coordination: Coordination normal.     Gait: Gait normal.     Deep Tendon Reflexes: Reflexes are normal and symmetric.  Psychiatric:        Behavior: Behavior normal.        Thought Content: Thought content normal.        Judgment: Judgment normal.     Lab Results  Component Value Date   WBC 7.8 12/21/2023   HGB 15.2 12/21/2023   HCT 44.1 12/21/2023   PLT 151.0 12/21/2023   GLUCOSE 92 12/21/2023   CHOL 120 12/21/2023   TRIG 104.0 12/21/2023   HDL 41.80 12/21/2023   LDLCALC 57 12/21/2023   ALT 23 12/21/2023   AST 18 12/21/2023   NA 139 12/21/2023   K 3.6 12/21/2023   CL 101 12/21/2023   CREATININE 1.05 12/21/2023   BUN 14 12/21/2023   CO2 29 12/21/2023   TSH 0.53 12/16/2021   INR 1.0 10/10/2007   HGBA1C 5.3 12/21/2023    CT CORONARY MORPH W/CTA COR W/SCORE W/CA W/CM &/OR WO/CM Addendum Date: 03/27/2023 ADDENDUM REPORT: 03/27/2023 21:37 EXAM: OVER-READ INTERPRETATION  CT CHEST The following report is an over-read performed by radiologist Dr. Suzen Dials of [facility] on 03/27/2023. This over-read does not include interpretation of cardiac or coronary anatomy or pathology. The coronary calcium  score/coronary CTA interpretation by the cardiologist is attached. COMPARISON:  February 13, 2018 FINDINGS: Cardiovascular: There are no significant extracardiac vascular findings. Mediastinum/Nodes: There are no enlarged lymph nodes within the visualized mediastinum. Lungs/Pleura: There is no pleural effusion. A 2 mm posterolateral right middle lobe pulmonary nodule is noted (axial CT image 3, CT series 11). This area is not clearly imaged on the prior study. Upper abdomen: No significant findings in the visualized upper abdomen. Musculoskeletal/Chest wall: No chest wall mass or suspicious osseous  findings within the visualized chest. IMPRESSION: 2 mm right middle lobe pulmonary nodule. No follow-up needed if patient is low-risk.This recommendation follows the consensus statement: Guidelines for Management of Incidental Pulmonary Nodules Detected on CT Images: From the Fleischner Society 2017; Radiology 2017; 284:228-243. Electronically Signed   By: Suzen Dials M.D.   On: 03/27/2023 21:37   Result Date: 03/27/2023 CLINICAL DATA:  Chest pain EXAM: Cardiac/Coronary CTA TECHNIQUE: A non-contrast, gated CT scan was obtained with axial slices of 3 mm through the heart for calcium  scoring. Calcium  scoring was performed  using the Agatston method. A 120 kV prospective, gated, contrast cardiac scan was obtained. Gantry rotation speed was 250 msecs and collimation was 0.6 mm. Two sublingual nitroglycerin  tablets (0.8 mg) were given. The 3D data set was reconstructed in 5% intervals of the 35-75% of the R-R cycle. Diastolic phases were analyzed on a dedicated workstation using MPR, MIP, and VRT modes. The patient received 95 cc of contrast. FINDINGS: Image quality: Excellent. Noise artifact is: Limited. Coronary Arteries:  Normal coronary origin.  Right dominance. Left main: The left main is a large caliber vessel with a normal take off from the left coronary cusp that bifurcates to form a left anterior descending artery and a left circumflex artery. There is no plaque or stenosis. Left anterior descending artery: The LAD contains minimal mixed density plaque (<25%) in all segments. D1 contains minimal mixed density plaque (<25%). D2 is small and patent. Left circumflex artery: The LCX is non-dominant. There is mild mixed density plaque in the mid LCX (25-49%). OM1 contains minimal calcified plaque (<25%). OM2/3 are patent. Right coronary artery: The RCA is dominant with normal take off from the right coronary cusp. There is minimal mixed density plaque (<25%) in all segments. The RCA terminates as a PDA and  right posterolateral branch without evidence of plaque or stenosis. Right Atrium: Right atrial size is within normal limits. Right Ventricle: The right ventricular cavity is within normal limits. Left Atrium: Left atrial size is normal in size with no left atrial appendage filling defect. Small PFO. Left Ventricle: The ventricular cavity size is within normal limits. Pulmonary arteries: Normal in size. Pulmonary veins: Normal pulmonary venous drainage. Pericardium: Normal thickness without significant effusion or calcium  present. Cardiac valves: The aortic valve is trileaflet without significant calcification. The mitral valve is normal without significant calcification. Aorta: Normal caliber without significant disease. Extra-cardiac findings: See attached radiology report for non-cardiac structures. IMPRESSION: 1. Coronary calcium  score of 432. This was 96th percentile for age-, sex, and race-matched controls. 2. Total plaque volume 776 mm3 which is 89th percentile for age- and sex-matched controls (calcified plaque 151 mm3; non-calcified plaque 625 mm3). TPV is extensive. 3. Normal coronary origin with right dominance. 4. Mild mixed density plaque (25-49%) in the mid LCX. 5. Minimal mixed density plaque in the LAD (<25%). 6. Minimal mixed density plaque in the RCA (<25%). RECOMMENDATIONS: 1. CAD-RADS 2: Mild non-obstructive CAD (25-49%). Consider non-atherosclerotic causes of chest pain. Consider preventive therapy and risk factor modification. Darryle Decent, MD Electronically Signed: By: Darryle Decent M.D. On: 03/09/2023 13:30    Assessment & Plan:   Problem List Items Addressed This Visit     Essential hypertension   Blood pressure is controlled on Zestoretic       Upper respiratory infection - Primary   DayQuil, NyQuil Z pack if worse  POC Strep (-), COVID (-)      Relevant Medications   azithromycin  (ZITHROMAX  Z-PAK) 250 MG tablet      Meds ordered this encounter  Medications    azithromycin  (ZITHROMAX  Z-PAK) 250 MG tablet    Sig: As directed    Dispense:  6 tablet    Refill:  0      Follow-up: Return for f/u with PCP.  Marolyn Noel, MD "

## 2024-01-09 NOTE — Assessment & Plan Note (Addendum)
 Blood pressure is controlled on Zestoretic 

## 2024-01-09 NOTE — Patient Instructions (Signed)
 Use DayQuil, NyQuil as needed Take Z pack if worse

## 2024-01-09 NOTE — Assessment & Plan Note (Addendum)
 DayQuil, NyQuil Z pack if worse  POC Strep, COVID (-)
